# Patient Record
Sex: Male | Born: 1965 | Race: White | Hispanic: No | Marital: Married
Health system: Southern US, Community
[De-identification: ages and names within clinical notes are randomized; demographics above are authoritative.]

## PROBLEM LIST (undated history)

## (undated) DIAGNOSIS — F1020 Alcohol dependence, uncomplicated: Secondary | ICD-10-CM

## (undated) DIAGNOSIS — F410 Panic disorder [episodic paroxysmal anxiety] without agoraphobia: Secondary | ICD-10-CM

## (undated) DIAGNOSIS — K625 Hemorrhage of anus and rectum: Secondary | ICD-10-CM

## (undated) DIAGNOSIS — E119 Type 2 diabetes mellitus without complications: Secondary | ICD-10-CM

## (undated) DIAGNOSIS — I10 Essential (primary) hypertension: Secondary | ICD-10-CM

## (undated) HISTORY — DX: Hemorrhage of anus and rectum: K62.5

## (undated) HISTORY — PX: OTHER SURGICAL HISTORY: SHX169

## (undated) HISTORY — DX: Alcohol dependence, uncomplicated: F10.20

## (undated) HISTORY — DX: Essential (primary) hypertension: I10

---

## 2004-10-16 ENCOUNTER — Ambulatory Visit (HOSPITAL_BASED_OUTPATIENT_CLINIC_OR_DEPARTMENT_OTHER): Admission: RE | Admit: 2004-10-16 | Discharge: 2004-10-16 | Payer: Self-pay | Admitting: Family Medicine

## 2007-11-14 ENCOUNTER — Emergency Department (HOSPITAL_COMMUNITY): Admission: EM | Admit: 2007-11-14 | Discharge: 2007-11-14 | Payer: Self-pay | Admitting: Emergency Medicine

## 2007-11-16 ENCOUNTER — Ambulatory Visit (HOSPITAL_COMMUNITY): Admission: RE | Admit: 2007-11-16 | Discharge: 2007-11-16 | Payer: Self-pay | Admitting: Orthopedic Surgery

## 2008-02-10 ENCOUNTER — Ambulatory Visit (HOSPITAL_COMMUNITY): Admission: RE | Admit: 2008-02-10 | Discharge: 2008-02-10 | Payer: Self-pay | Admitting: Orthopedic Surgery

## 2010-07-19 ENCOUNTER — Encounter: Admission: RE | Admit: 2010-07-19 | Discharge: 2010-07-19 | Payer: Self-pay | Admitting: Family Medicine

## 2011-01-19 ENCOUNTER — Encounter: Payer: Self-pay | Admitting: Family Medicine

## 2011-04-06 ENCOUNTER — Emergency Department (HOSPITAL_COMMUNITY): Payer: BC Managed Care – PPO

## 2011-04-06 ENCOUNTER — Emergency Department (HOSPITAL_COMMUNITY)
Admission: EM | Admit: 2011-04-06 | Discharge: 2011-04-07 | Disposition: A | Discharge status: Other Acute Inpatient Hospital | Payer: BC Managed Care – PPO | Attending: Emergency Medicine | Admitting: Emergency Medicine

## 2011-04-06 DIAGNOSIS — IMO0002 Reserved for concepts with insufficient information to code with codable children: Secondary | ICD-10-CM | POA: Insufficient documentation

## 2011-04-06 DIAGNOSIS — Z79899 Other long term (current) drug therapy: Secondary | ICD-10-CM | POA: Insufficient documentation

## 2011-04-06 DIAGNOSIS — F411 Generalized anxiety disorder: Secondary | ICD-10-CM | POA: Insufficient documentation

## 2011-04-06 DIAGNOSIS — G8929 Other chronic pain: Secondary | ICD-10-CM | POA: Insufficient documentation

## 2011-04-06 DIAGNOSIS — K7460 Unspecified cirrhosis of liver: Secondary | ICD-10-CM | POA: Insufficient documentation

## 2011-04-06 DIAGNOSIS — M25519 Pain in unspecified shoulder: Secondary | ICD-10-CM | POA: Insufficient documentation

## 2011-04-06 DIAGNOSIS — F1010 Alcohol abuse, uncomplicated: Secondary | ICD-10-CM | POA: Insufficient documentation

## 2011-04-06 LAB — CBC
HCT: 40 % (ref 39.0–52.0)
Hemoglobin: 13.8 g/dL (ref 13.0–17.0)

## 2011-04-06 LAB — DIFFERENTIAL
Basophils Absolute: 0.1 10*3/uL (ref 0.0–0.1)
Basophils Relative: 1 % (ref 0–1)
Eosinophils Absolute: 0.1 10*3/uL (ref 0.0–0.7)
Lymphs Abs: 2 10*3/uL (ref 0.7–4.0)
Monocytes Absolute: 0.8 10*3/uL (ref 0.1–1.0)
Monocytes Relative: 8 % (ref 3–12)
Neutro Abs: 6.6 10*3/uL (ref 1.7–7.7)
Neutrophils Relative %: 70 % (ref 43–77)

## 2011-04-06 LAB — COMPREHENSIVE METABOLIC PANEL
ALT: 50 U/L (ref 0–53)
BUN: 7 mg/dL (ref 6–23)
Calcium: 8.1 mg/dL — ABNORMAL LOW (ref 8.4–10.5)
GFR calc Af Amer: >60 mL/min (ref >60)
GFR calc non Af Amer: >60 mL/min (ref >60)
Glucose, Bld: 162 mg/dL — ABNORMAL HIGH (ref 70–99)
Potassium: 3.8 mEq/L (ref 3.5–5.1)
Sodium: 139 mEq/L (ref 135–145)

## 2011-04-06 LAB — URINALYSIS, ROUTINE W REFLEX MICROSCOPIC
Bilirubin Urine: NEGATIVE
Glucose, UA: NEGATIVE mg/dL
Ketones, ur: NEGATIVE mg/dL
Nitrite: NEGATIVE
Specific Gravity, Urine: 1.012 (ref 1.005–1.030)

## 2011-04-06 LAB — RAPID URINE DRUG SCREEN, HOSP PERFORMED
Benzodiazepines: NOT DETECTED
Opiates: NOT DETECTED
Tetrahydrocannabinol: NOT DETECTED

## 2011-04-06 LAB — ACETAMINOPHEN LEVEL: Acetaminophen (Tylenol), Serum: <10 ug/mL — ABNORMAL LOW (ref 10–30)

## 2011-04-06 LAB — SALICYLATE LEVEL: Salicylate Lvl: <4 mg/dL (ref 2.8–20.0)

## 2011-04-07 ENCOUNTER — Inpatient Hospital Stay (HOSPITAL_COMMUNITY)
Admission: AD | Admit: 2011-04-07 | Discharge: 2011-04-11 | DRG: 750 | Disposition: A | Discharge status: Nursing Home (Includes ICF) | Payer: BC Managed Care – PPO | Source: Ambulatory Visit | Attending: Psychiatry | Admitting: Psychiatry

## 2011-04-07 DIAGNOSIS — K769 Liver disease, unspecified: Secondary | ICD-10-CM | POA: Diagnosis present

## 2011-04-07 DIAGNOSIS — F1020 Alcohol dependence, uncomplicated: Principal | ICD-10-CM | POA: Diagnosis present

## 2011-04-07 DIAGNOSIS — I10 Essential (primary) hypertension: Secondary | ICD-10-CM | POA: Diagnosis present

## 2011-04-07 DIAGNOSIS — D696 Thrombocytopenia, unspecified: Secondary | ICD-10-CM | POA: Diagnosis present

## 2011-04-08 DIAGNOSIS — F1020 Alcohol dependence, uncomplicated: Secondary | ICD-10-CM

## 2011-04-09 LAB — HEPATIC FUNCTION PANEL
AST: 188 U/L — ABNORMAL HIGH (ref 0–37)
Alkaline Phosphatase: 166 U/L — ABNORMAL HIGH (ref 39–117)
Total Protein: 8 g/dL (ref 6.0–8.3)

## 2011-04-09 LAB — HEMOGLOBIN: Hemoglobin: 13.4 g/dL (ref 13.0–17.0)

## 2011-04-09 LAB — AMMONIA: Ammonia: 71 umol/L — ABNORMAL HIGH (ref 11–35)

## 2011-04-09 NOTE — H&P (Signed)
NAME:  Hunter Davis, Hunter Davis NO.:  [id number]  MEDICAL RECORD NO.:  [id number]           PATIENT TYPE:  I  LOCATION:  0300                          FACILITY:  [facility]  PHYSICIAN:  Anselm Jungling, MD  DATE OF BIRTH:  [birth year]  DATE OF ADMISSION:  04/07/2011 DATE OF DISCHARGE:                      PSYCHIATRIC ADMISSION ASSESSMENT   This is on a 45 year old male that was voluntarily admitted on April 07, 2011.  HISTORY OF PRESENT ILLNESS:  Patient presented here to get help from his alcohol use.  He states that he is "drinking too much."  He states that his wife wanted him to get help to avert any physical problems.  He reports drinking in the morning.  He has been experiencing blackouts. He denies any seizure activity.  Has been drinking more consistently and heavier since his shoulder injury.  He is unemployed.  He denies any suicidal thoughts.  PAST PSYCHIATRIC HISTORY:  First admission to the [facility].  No current outpatient mental health therapy.  SOCIAL HISTORY:  Patient is married.  He has no children.  He is unemployed.  He has no legal issues.  FAMILY HISTORY:  His aunt has been on lithium.  ALCOHOL AND DRUG HISTORY:  Again, has been drinking heavily without seizures but experiencing blackouts and drinking in the morning.  His blood alcohol level was 461 on admission.  Denies any other recreational drug use.  PRIMARY CARE PROVIDER:  [facility].  MEDICAL PROBLEMS: 1. History of hypertension. 2. Sleep apnea.  MEDICATIONS:  Patient lists: 1. Oxycodone. 2. Combivent inhaler. 3. Losartan 100/12.5 daily for hypertension.  DRUG ALLERGIES:  No known allergies.  PHYSICAL EXAM:  Reviewed.  Patient moves all extremities.  No obvious focal weakness.  Had some tenderness to his right shoulder.  He was wearing a sling from an old injury.  His mental status appeared baseline.  No facial asymmetry.  Patient was initially in the emergency room  complaining of coughing up blood but no blood was noticed in the posterior pharynx.  LABORATORY DATA:  Shows a platelet count of 119.  Urinalysis is negative.  Urine drug screen is negative.  Acetaminophen level less than 10.  Alcohol level initial was 461.  Aspirin was less than 4.  His SGOT is at 281, SGPT of 50, alkaline phosphate also elevated at 195.  MENTAL STATUS EXAM:  Patient is resting in bed with his CPAP on.  He was arousable.  He sat up on the side of the bed to participate in the interview.  He had poor eye contact, somewhat disheveled.  Speech is soft spoken.  Mood is neutral.  He appears somewhat sad, depressed, but he denies any suicidal or homicidal thoughts or psychotic symptoms. Initially seemed somewhat confused but then he was able to recall answers when questions were asked.  AXIS I:  Alcohol dependence. AXIS II:  Deferred. AXIS III: 1. A history of sleep apnea. 2. Hypertension. 3. Thrombocytopenia. AXIS IV:  Problems with primary support group, occupation, other psychosocial problems. AXIS V:  Current is 40.  PLAN:  Place patient on the Librium protocol.  We will have a  CPAP machine available.  We will contact wife for support.  Assess his motivation for rehab.  Patient will need to follow up with his primary care provider for elevated liver enzymes and thrombocytopenia.     Landry Corporal, N.P.   ______________________________ Anselm Jungling, MD    JO/MEDQ  D:  04/08/2011  T:  04/08/2011  Job:  [id number]  Electronically Signed by Limmie PatriciaP. on 04/08/2011 02:12:52 PM Electronically Signed by Geralyn Flash MD on 04/09/2011 10:49:15 AM

## 2011-04-11 ENCOUNTER — Inpatient Hospital Stay (HOSPITAL_COMMUNITY)
Admission: EM | Admit: 2011-04-11 | Discharge: 2011-04-14 | DRG: 557 | Disposition: A | Discharge status: Other Acute Inpatient Hospital | Payer: BC Managed Care – PPO | Source: Ambulatory Visit | Attending: Internal Medicine | Admitting: Internal Medicine

## 2011-04-11 ENCOUNTER — Inpatient Hospital Stay: Admit: 2011-04-11 | Payer: Self-pay | Source: Ambulatory Visit | Admitting: Internal Medicine

## 2011-04-11 DIAGNOSIS — K7030 Alcoholic cirrhosis of liver without ascites: Principal | ICD-10-CM | POA: Diagnosis present

## 2011-04-11 DIAGNOSIS — E871 Hypo-osmolality and hyponatremia: Secondary | ICD-10-CM | POA: Diagnosis present

## 2011-04-11 DIAGNOSIS — J4489 Other specified chronic obstructive pulmonary disease: Secondary | ICD-10-CM | POA: Diagnosis present

## 2011-04-11 DIAGNOSIS — G4733 Obstructive sleep apnea (adult) (pediatric): Secondary | ICD-10-CM | POA: Diagnosis present

## 2011-04-11 DIAGNOSIS — D649 Anemia, unspecified: Secondary | ICD-10-CM | POA: Diagnosis present

## 2011-04-11 DIAGNOSIS — K7682 Hepatic encephalopathy: Secondary | ICD-10-CM | POA: Diagnosis present

## 2011-04-11 DIAGNOSIS — I959 Hypotension, unspecified: Secondary | ICD-10-CM | POA: Diagnosis present

## 2011-04-11 DIAGNOSIS — F1020 Alcohol dependence, uncomplicated: Secondary | ICD-10-CM | POA: Diagnosis present

## 2011-04-11 DIAGNOSIS — D696 Thrombocytopenia, unspecified: Secondary | ICD-10-CM | POA: Diagnosis present

## 2011-04-11 DIAGNOSIS — J449 Chronic obstructive pulmonary disease, unspecified: Secondary | ICD-10-CM | POA: Diagnosis present

## 2011-04-11 DIAGNOSIS — N179 Acute kidney failure, unspecified: Secondary | ICD-10-CM | POA: Diagnosis present

## 2011-04-11 DIAGNOSIS — K7290 Hepatic failure, unspecified without coma: Secondary | ICD-10-CM | POA: Diagnosis present

## 2011-04-11 LAB — PROTIME-INR: INR: 1.37 (ref 0.00–1.49)

## 2011-04-11 LAB — DIFFERENTIAL
Basophils Absolute: 0 10*3/uL (ref 0.0–0.1)
Eosinophils Relative: 2 % (ref 0–5)
Lymphocytes Relative: 17 % (ref 12–46)
Monocytes Absolute: 1.3 10*3/uL — ABNORMAL HIGH (ref 0.1–1.0)
Monocytes Relative: 15 % — ABNORMAL HIGH (ref 3–12)
Neutro Abs: 5.4 10*3/uL (ref 1.7–7.7)

## 2011-04-11 LAB — CBC
MCHC: 34 g/dL (ref 30.0–36.0)
Platelets: 120 10*3/uL — ABNORMAL LOW (ref 150–400)
RBC: 3.87 MIL/uL — ABNORMAL LOW (ref 4.22–5.81)
RDW: 19.3 % — ABNORMAL HIGH (ref 11.5–15.5)
WBC: 8.3 10*3/uL (ref 4.0–10.5)

## 2011-04-11 LAB — COMPREHENSIVE METABOLIC PANEL
ALT: 30 U/L (ref 0–53)
Albumin: 2.5 g/dL — ABNORMAL LOW (ref 3.5–5.2)
BUN: 27 mg/dL — ABNORMAL HIGH (ref 6–23)
Chloride: 97 mEq/L (ref 96–112)
GFR calc non Af Amer: 46 mL/min — ABNORMAL LOW (ref >60)
Glucose, Bld: 169 mg/dL — ABNORMAL HIGH (ref 70–99)

## 2011-04-11 LAB — AMMONIA: Ammonia: 79 umol/L — ABNORMAL HIGH (ref 11–35)

## 2011-04-11 NOTE — H&P (Signed)
NAME:  Hunter Davis, BOUILLON NO.:  [id number]  MEDICAL RECORD NO.:  [id number]           PATIENT TYPE:  O  LOCATION:  ED                           FACILITY:  [facility]  PHYSICIAN:  Talmage Nap, MD  DATE OF BIRTH:  [birth year]  DATE OF ADMISSION:  04/10/2011 DATE OF DISCHARGE:                               HISTORY & PHYSICAL   PRIMARY CARE PHYSICIAN:  Unassigned.  History obtainable from spouse mainly and partly from the patient.  CHIEF COMPLAINT:  Low blood pressure observed at [facility] on April 10, 2011.  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  However, on April 10, 2011, the patient was noticed to have a low blood pressure. Systolic blood pressure was said to be 80 mmHg and the patient was also said to be slightly disoriented.  There was no history of fever, no chills, no rigor, no vomiting.  The patient claimed that periodically he was being given lactulose and had an episode of diarrhea.  He denies any dysuria or hematuria.  Also denies any history of hematochezia.  The patient's spouse observed that since the patient has been in [facility], his appetite has been very poor and also with poor oral intake. The patient's blood pressure was said to have remained persistently low. Hence, he was brought to the emergency room to be evaluated.  PAST MEDICAL HISTORY: 1. 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 2. Hypertension. 3. Chronic alcoholism. 4. GERD. 5. Sleep apnea.  PAST SURGICAL HISTORY: Right rotator cuff repair.  MEDICATIONS:  Preadmission meds without dosages include: 1. Multivitamin 2. Thiamine. 3. Librium. 4. Paxil. 5. Cozaar. 6. Hydrochlorothiazide. 7. Protonix. 8. Carafate. 9. C-chronulose. 10.Antacid/antigas. 11.Mild of magnesia  oral. 12.Vistaril oral. 13.Zofran. 14.Imodium oral. 15.Ipratropium/albuterol inhaler.  ALLERGIES:  He has no known allergy.  SOCIAL HISTORY:  The patient drinks a pint of vodka almost every day and has been drinking in the past 30 years.  No history of tobacco use. Unemployed and is presently undergoing alcohol detox at the [facility].  FAMILY HISTORY:  Unknown.  REVIEW OF SYSTEMS:  The patient denies any history of headaches.  No blurred vision.  No nausea or vomiting.  No fever, no chills, no rigor, no chest pain, no shortness of breath.  Complained of mild epigastric discomfort.  Denies any diarrhea or hematochezia.  No dysuria or hematuria.  No swelling of the lower extremities.  No intolerance to heat or cold.  No known psychiatric disorder.  PHYSICAL EXAMINATION:  GENERAL:  Young man, jaundiced, looking very unkempt with suboptimal hydration. VITAL SIGNS:  His present vital signs are blood pressure 102/56, pulse is 82, respiratory rate 18, temperature is 98.2. HEENT:  Icteric, pallor.  Both pupils are reactive to light and extraocular muscles are intact. NECK:  No jugular venous distention.  No carotid bruit.  No lymphadenopathy. CHEST:  Shows spider telangectasia, otherwise, air entry is adequate.  No adventitial sounds. HEART:  Heart sounds are S1 and  S2. ABDOMEN:  Soft with mild epigastric tenderness.  Liver, spleen tip not palpable.  Bowel sounds are positive. EXTREMITIES:  No pedal edema. NEUROLOGIC EXAM:  Negative for asterixis. NEUROPSYCHIATRIC:  Evaluation is unremarkable. SKIN:  Showed decreased turgor.  LABORATORY DATA:  Ammonia level is 79.  Comprehensive metabolic panel shows sodium of 129, potassium of 4.2, chloride of 97, bicarb is 23 glucose is 169, BUN is 27, creatinine is 1.62, calcium is 7.9, total protein 7.3, albumin is 2.5.  LFTs showed AST of 157, ALT 30, alkaline phosphatase 139, total bilirubin 7.2.  Coagulation profile showed PT 17.1,  INR 1.37 and APTT of 38.  Hematological indices showed WBC of 8.3, hemoglobin of 11.9, hematocrit 35.0, MCV of 90.4 with a platelet count of 120.  IMPRESSION: 1. Decompensated liver cirrhosis. 2. Hypotension.  Blood pressure not stable. 3. Chronic alcohol abuse (the patient still actively drinks). 4. Obstructive sleep apnea. 5. Gastroesophageal reflux disease. 6. Asthma. 7. Hyponatremia. 8. Thrombocytopenia.  PLAN:  Plan is to admit the patient to general medical floor.  The patient will be rehydrated with normal saline IV to go at rate of 75 cc an hour.  He will be on Protonix IV q.12 and, omeprazole 40 mg p.o. daily.  Other medications to be given to the patient include lactulose 20 cc p.o. t.i.d. and Paxil 10 mg p.o. b.i.d.  The patient will also be on thiamine 100 mg p.o. daily, and on folic acid 1 mg p.o. daily.  He will be given CPAP at night, titrate for obstructive sleep apnea and TED stockings for DVT prophylaxis. Other labs to be ordered on this patient will include repeating ammonia level as well as coagulation profile, PT, PTT, and INR.  CBC, CMP and magnesium will be repeated in a.m.  The patient will be followed and anticoagulated on day-to-day basis.     Talmage Nap, MD     CN/MEDQ  D:  04/11/2011  T:  04/11/2011  Job:  [id number]  Electronically Signed by Talmage Nap  on 04/11/2011 05:31:24 AM

## 2011-04-12 LAB — PROTIME-INR: INR: 1.43 (ref 0.00–1.49)

## 2011-04-12 LAB — DIFFERENTIAL
Basophils Absolute: 0 10*3/uL (ref 0.0–0.1)
Eosinophils Absolute: 0.1 10*3/uL (ref 0.0–0.7)
Eosinophils Relative: 2 % (ref 0–5)
Lymphocytes Relative: 16 % (ref 12–46)
Lymphs Abs: 1 10*3/uL (ref 0.7–4.0)
Neutrophils Relative %: 66 % (ref 43–77)

## 2011-04-12 LAB — CBC
HCT: 33.9 % — ABNORMAL LOW (ref 39.0–52.0)
MCV: 92.4 fL (ref 78.0–100.0)
Platelets: 110 10*3/uL — ABNORMAL LOW (ref 150–400)
RBC: 3.67 MIL/uL — ABNORMAL LOW (ref 4.22–5.81)
RDW: 20.2 % — ABNORMAL HIGH (ref 11.5–15.5)
WBC: 6.2 10*3/uL (ref 4.0–10.5)

## 2011-04-12 LAB — COMPREHENSIVE METABOLIC PANEL
AST: 136 U/L — ABNORMAL HIGH (ref 0–37)
Albumin: 2.2 g/dL — ABNORMAL LOW (ref 3.5–5.2)
BUN: 11 mg/dL (ref 6–23)
Creatinine, Ser: 0.72 mg/dL (ref 0.4–1.5)
GFR calc Af Amer: >60 mL/min (ref >60)
Total Protein: 6.8 g/dL (ref 6.0–8.3)

## 2011-04-12 LAB — AMMONIA: Ammonia: 66 umol/L — ABNORMAL HIGH (ref 11–35)

## 2011-04-12 LAB — APTT: aPTT: 42 seconds — ABNORMAL HIGH (ref 24–37)

## 2011-04-13 LAB — LIPID PANEL
Cholesterol: 174 mg/dL (ref 0–200)
HDL: 20 mg/dL — ABNORMAL LOW (ref >39)
LDL Cholesterol: 119 mg/dL — ABNORMAL HIGH (ref 0–99)
Total CHOL/HDL Ratio: 8.7 RATIO
Triglycerides: 175 mg/dL — ABNORMAL HIGH (ref <150)

## 2011-04-18 NOTE — Discharge Summary (Signed)
  NAME:  Hunter Davis, BUSCEMI NO.:  [id number]  MEDICAL RECORD NO.:  [id number]           PATIENT TYPE:  I  LOCATION:  0300                          [facility]ACILITY:  [facility]  PHYSICIAN:  Anselm Jungling, MD  DATE OF BIRTH:  08-19-1966  DATE OF ADMISSION:  04/07/2011 DATE OF DISCHARGE:  04/11/2011                              DISCHARGE SUMMARY   IDENTIFYING INFORMATION:  This is a 45 year old Caucasian male.  This is a voluntary admission.  HISTORY OF PRESENT ILLNESS:  First [facility] admission for [name] who presented requesting detox from alcohol.  His wife had been encouraging to come for treatment.  He had been experiencing blackouts and drinking more consistently and heavier since he had had a recent shoulder injury.  He was receiving no current outpatient treatment at the time of admission but had a history of heavy alcohol use and presented with an initial alcohol level of 461 mg/dL.  MEDICAL EVALUATION:  A full physical exam was done in the emergency room and is noted in the record.  He was noted to be thrombocytopenic with a platelet count of 119,000.  Initial alcohol level of 461 mg/dL. Elevated transaminases SGOT 281, SGPT 50, alkaline phosphatase 195 and a total bilirubin of 3.7.  CHRONIC MEDICAL PROBLEMS: 1. Alcohol-induced liver dysfunction. 2. Hypertension. 3. Sleep apnea.  COURSE OF HOSPITALIZATION:  He was admitted to our Dual Diagnosis Unit and gradually assimilated into the milieu.  He was started on a Librium detox protocol and he continued using his CPAP machine for sleep apnea while he was here.  He appeared to be mildly icteric in appearance and we repeated his hepatic function panel which revealed a total bilirubin of 8.1, SGOT 188, SGPT 37 and alkaline phosphatase 166.  His ammonia level was also found to be 71 and he was started on lactulose 30 grams t.i.d.  His participation in group and unit activities was appropriate.  On the evening of April 10, 2011, he was found to be tachycardic with a pulse of 112, O2 saturation of 87% and blood pressure 87/58 and was transferred to our emergency room for further evaluation and subsequently admitted to our Medical Unit.  DISCHARGE DIAGNOSES:  AXIS I:  Alcohol dependence. AXIS II:  No diagnosis. AXIS III:  Sleep apnea, rule out alcohol-induced liver dysfunction, rule out alcohol-induced thrombocytopenia. AXIS IV:  Deferred. AXIS V:  Deferred.     Margaret A. Lorin Picket, N.P.   ______________________________ Anselm Jungling, MD    MAS/MEDQ  D:  04/14/2011  T:  04/14/2011  Job:  [id number]  Electronically Signed by Kari Baars N.P. on 04/14/2011 11:02:17 AM Electronically Signed by Geralyn Flash MD on 04/18/2011 11:38:30 AM

## 2011-04-22 NOTE — Discharge Summary (Signed)
NAME:  Hunter Davis, Hunter Davis               ACCOUNT NO.:  [id number]  MEDICAL RECORD NO.:  [id number]           PATIENT TYPE:  I  LOCATION:  1506                         FACILITY:  [facility]  PHYSICIAN:  Nyja Westbrook I Bryse Blanchette, MD      DATE OF BIRTH:  1966/09/12  DATE OF ADMISSION:  04/11/2011 DATE OF DISCHARGE:  04/14/2011                              DISCHARGE SUMMARY   The patient will be discharged to [facility] for further assessment with alcohol detox.  DISCHARGE DIAGNOSES: 1. Decompensated liver failure with mild hepatic encephalopathy,     improved. 2. Abnormal liver function tests secondary to alcoholic liver disease. 3. Ongoing alcohol abuse and dependence. 4. Acute knee injury, resolved. 5. Obstructive sleep apnea. 6. History of asthma/chronic obstructive pulmonary disease. 7. Thrombocytopenia. 8. Anemia with stable hemoglobin around 11.4.  DISCHARGE MEDICATIONS: 1. Combivent 1 nebs q.6 h. p.r.n. 2. Aldactone 25 mg p.o. daily. 3. Folic acid 1 mg p.o. daily. 4. Thiamine 100 mg p.o. daily. 5. Lactulose 20 mL p.o. b.i.d. to have 2-3 bowel movements daily. 6. Rifaximin 200 mg p.o. daily. 7. Prednisone 30 mg p.o. daily to be taper within 10 days.  FOLLOWUP:  The patient will follow up with his physician within 1-2 weeks to follow up his liver enzymes.  CONSULTATION:  None.  PROCEDURE:  None.  HOSPITAL COURSE:  This is a 45 year old man with a history of liver cirrhosis secondary to alcohol abuse who presented with confusion and found to have a low blood pressure.  On IV fluids, his kidney failure improved to 0.72.  There was no evidence of bleeding, either hemoptysis or hematemesis.  His hemoglobin remained stable.  The patient denied any chest pain.  His LFTs were significantly elevated with a bilirubin of 7, alkaline phosphatase 122, AST 136, and ALT 32, which is more likely secondary to alcoholic cirrhosis.  The patient's LFTs trending down and the patient was advised to  follow up with his physician within 1 week to further assess his liver enzymes.  Currently, the patient is jaundiced. His liver enzymes on April 11th, total bilirubin 8.1, direct bilirubin 4.4, and indirect bilirubin 3.7, alk phos 166, AST 188, ALT 37, total protein 8, albumin 2.7.  Today, the patient's LFTs is total bilirubin 7, alkaline phosphatase 122, AST 136, ALT 32, total protein 6.8, albumin 2.2, and calcium 8.2.  Chest x-ray, cardiomegaly and chronic bronchitic changes and chronic interstitial lung disease.  PHYSICAL EXAMINATION:  VITAL SIGNS:  Temperature 98.3, pulse rate 73, blood pressure 106/63, respiratory rate 18. HEENT:  The patient is jaundiced, not pale.  Not in respiratory distress. LUNGS:  Bilateral wheezing. HEART:  S1, S2.  No tachycardia.  No abnormal rhythm. ABDOMEN:  Distended.  Bowel sound positive.  Hepatomegaly, about 4 cm below the costophrenic angle. EXTREMITY:  No lower limb edema.  The patient will be discharged to [facility], that will be low- protein diet, hepatic diet.  ACTIVITY:  As tolerated.     Caridad Silveira Bosie Helper, MD     HIE/MEDQ  D:  04/14/2011  T:  04/15/2011  Job:  [id number]  Electronically Signed by  Ebony Cargo MD on 04/22/2011 01:50:41 PM

## 2011-04-29 NOTE — Group Therapy Note (Signed)
  NAME:  Hunter Davis, Hunter Davis NO.:  [id number]  MEDICAL RECORD NO.:  [id number]           PATIENT TYPE:  I  LOCATION:  1506                         FACILITY:  [facility]  PHYSICIAN:  Homero Fellers, MD   DATE OF BIRTH:  09/02/1966                                PROGRESS NOTE   SUBJECTIVE: The patient is more alert today.  No new complaint except for loose stool probably secondary to lactulose.  OBJECTIVE: Vital Signs:  Blood pressure of 113/74, pulse 80, respirations 18, temperature is 97.5, O2 sat is 94% on room air.  General:  The patient is comfortable, appeared to be in no distress.  HEENT:  Pallor. Extraocular muscles are intact.  Neck:  Supple.  No JVD, adenopathy or thyromegaly.  LUNGS:  Mild expiratory wheezing.  Abdomen:  Slightly distended.  Bowel sounds present.  No masses.  Extremities:  Trace edema.  Laboratory:  Hemoglobin 11.4, white count 6.2, platelet count is 110. Magnesium 1.9, ammonia down to 66, potassium 3.5, creatinine down to 0.72.  ASSESSMENT: 1. Decompensated liver failure with mild hepatic encephalopathy, which     is clinically improving. 2. Acute kidney failure which has also improved with hydration. 3. Continued alcohol abuse:  The patient and family are able to     inpatient rehab at this time. 4. Obstructive sleep apnea. 5. History of asthma with mild wheezing. 6. Chronic thrombocytopenia. 7. History of hyperlipidemia:  No lipid panel has been done yet.  PLAN: Reduced lactulose to twice a day.  Check lipid panel in the morning. Continue other supportive care.  The patient's family has arranged for an inpatient alcohol treatment program.  The social worker over here should coordinate the discharge which is planned for this coming Monday. Overall condition is stable.     Homero Fellers, MD     FA/MEDQ  D:  04/12/2011  T:  04/12/2011  Job:  [id number]  Electronically Signed by Homero Fellers  on 04/29/2011 02:37:04 AM

## 2011-04-29 NOTE — Discharge Summary (Signed)
NAME:  Hunter Davis, Hunter Davis NO.:  [id number]  MEDICAL RECORD NO.:  [id number]           PATIENT TYPE:  I  LOCATION:  1506                         FACILITY:  [facility]  PHYSICIAN:  Homero Fellers, MD   DATE OF BIRTH:  1966/01/17  DATE OF ADMISSION:  04/11/2011 DATE OF DISCHARGE:  04/10/2011                              DISCHARGE SUMMARY   Interim Discharge Summary  DISPOSITION:  To follow should call inpatient alcohol treatment program.  DISCHARGE DIAGNOSES: 1. Decompensated liver failure with mild hepatic encephalopathy, which     is improved. 2. Continuous alcohol abuse and dependence. 3. Acute kidney injury, which is currently improved with hydration. 4. Obstructive sleep apnea. 5. History of asthma, which is improved. 6. Chronic thrombocytopenia possibly secondary from liver cirrhosis. 7. Hyperlipidemia.  MEDICATION:  To be determined by the discharging physician.  CONSULTATIONS:  None.  PROCEDURES:  None.  HOSPITAL COURSE:  This is a 45 year old man with history of liver cirrhosis secondary to alcohol use, who presented with confusion and was found to have a low blood pressure at the  on April 10, 2011.  He was also noted to have poor appetite and was subsequently sent to the emergency room for evaluation.  He was admitted to triad group at [facility] and was optimized on IV fluids.  His kidney failure also improved with hydration from creatinine of 1.62 on admission to 0.72 before discharge.  The patient was also placed on lactulose for his confusion.  There was no withdrawal symptoms noticed throughout this admission.  He was also maintained on thiamine and multivitamin.  PHYSICAL EXAMINATION:  NEUROLOGIC:  At the time of this elevation, patient is awake and alert and oriented x3. VITAL SIGNS:  Blood pressure 143/66, pulse 75, respirations 18, temperature is 98.5, O2 sat is 94% on room air. GENERAL EXAMINATION:  Patient is comfortable, in no  distress. NECK:  Supple.  No JVD. LUNGS:  Clear bilaterally to auscultation. HEART:  S1, S2.  No murmurs. ABDOMEN:  Obese and slightly distended.  Bowel sounds present.  No masses. EXTREMITIES:  Trace edema.  LABORATORY DATA:  His ammonia level at admission was 71, which increased to 79, but it began to trend down to 66 yesterday.  The patient also had lipid panel done on this admission, which showed triglyceride of 175, LDL of 119 and total cholesterol of 174.  HDL was only 20.  CBC showed white count of 6.2; hemoglobin 11.4; platelet count of 110,00 likely low because of liver disease.  IMAGING STUDIES:  Not available.  The family has made arrangements for inpatient alcohol treatment at [facility].  I have also notified the case worker to coordinate the discharge, which should hopefully take place tomorrow.  From medical standpoint, patient appeared to be stable for discharge.  The family does not want him to be discharged home.  I think this is reasonable in view of his significant alcohol use despite obvious liver damage.  Patient should be discharged home on lactulose, which he can take once or twice a day and can take more doses as needed.  DIAGNOSES:  1. Decompensated liver failure, treated with lactulose.  This has     improved. 2. Acute kidney failure, which was treated with hydration.  This has     resolved. 3. Continued alcohol abuse:  Family has made arrangement for the     patient to go to inpatient alcohol treatment program. 4. Obstructive sleep apnea:  Patient can continue continuous positive     airway pressure at home.  Denies history of asthma.  Patient had     some wheezing on this admission and was placed on nebulizer.  At     the time of this dictation, wheezing has resolved.  The patient can     continue on his nebulizer and other aspirin medicine at home. 5. Thrombocytopenia:  Likely from liver disease and is fairly stable.  CONDITION AT DISCHARGE:   Stable.  DIET:  Low salt.  ACTIVITY:  As tolerated.  FOLLOWUP:  Patient should follow with primary care physician at the time of discharge.  I do not have the name of his physician available at this time.     Homero Fellers, MD     FA/MEDQ  D:  04/13/2011  T:  04/13/2011  Job:  [id number]  Electronically Signed by Homero Fellers  on 04/29/2011 02:37:01 AM

## 2011-05-04 ENCOUNTER — Emergency Department (HOSPITAL_COMMUNITY)
Admission: EM | Admit: 2011-05-04 | Discharge: 2011-05-04 | Disposition: A | Discharge status: Home / Self Care | Payer: BC Managed Care – PPO | Attending: Emergency Medicine | Admitting: Emergency Medicine

## 2011-05-04 DIAGNOSIS — K7460 Unspecified cirrhosis of liver: Secondary | ICD-10-CM | POA: Insufficient documentation

## 2011-05-04 DIAGNOSIS — K219 Gastro-esophageal reflux disease without esophagitis: Secondary | ICD-10-CM | POA: Insufficient documentation

## 2011-05-04 DIAGNOSIS — K644 Residual hemorrhoidal skin tags: Secondary | ICD-10-CM | POA: Insufficient documentation

## 2011-05-04 DIAGNOSIS — K625 Hemorrhage of anus and rectum: Secondary | ICD-10-CM | POA: Insufficient documentation

## 2011-05-04 DIAGNOSIS — I10 Essential (primary) hypertension: Secondary | ICD-10-CM | POA: Insufficient documentation

## 2011-05-04 LAB — CBC
HCT: 34.9 % — ABNORMAL LOW (ref 39.0–52.0)
MCH: 32.2 pg (ref 26.0–34.0)
MCHC: 33.8 g/dL (ref 30.0–36.0)
MCV: 95.1 fL (ref 78.0–100.0)
RDW: 18.5 % — ABNORMAL HIGH (ref 11.5–15.5)
WBC: 7.3 10*3/uL (ref 4.0–10.5)

## 2011-05-04 LAB — DIFFERENTIAL
Basophils Relative: 1 % (ref 0–1)
Eosinophils Relative: 3 % (ref 0–5)
Lymphs Abs: 1.6 10*3/uL (ref 0.7–4.0)
Monocytes Relative: 8 % (ref 3–12)
Neutro Abs: 4.8 10*3/uL (ref 1.7–7.7)

## 2011-05-13 NOTE — Op Note (Signed)
NAME:  Hunter Davis, Hunter Davis NO.:  [id number]   MEDICAL RECORD NO.:  [id number]          PATIENT TYPE:  AMB   LOCATION:  SDS                          FACILITY:  [facility]   PHYSICIAN:  Almedia Balls. Ranell Patrick, M.D. 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   POSTOPERATIVE DIAGNOSIS:  Right shoulder grade 3 acromioclavicular  separation.   PROCEDURE PERFORMED:  Right shoulder acromioclavicular reduction with  placement of coracoclavicular screw.   ATTENDING SURGEON:  Almedia Balls. Ranell Patrick, M.D.   ASSISTANT:  Donnie Coffin. Durwin Nora, P.A.   General anesthesia plus local was used.   ESTIMATED BLOOD LOSS:  Minimal.   FLUID REPLACEMENT:  1,000 mL crystalloid.   INSTRUMENT COUNTS:  Correct.   No complications.   Perioperative antibiotics were given.   INDICATIONS:  The patient is a 44 year old male with history of a fall  injuring his right shoulder.  The patient presented to the orthopedic  clinic with what appeared to be a grade 3/4 AC separation.  His clavicle  was displaced posteriorly and superiorly.  The patient had extreme pain  with his shoulder.  This happened within the last week due to the  patient's severe pain and our concern that this may be more of a grade 4  AC separation with some puncturing through the trapezius musculature.  We had discussed with the patient options for treatment to include  surgical management versus conservative management and observation.  He  would like to proceed with surgery to restore the [facility] joint congruity and  to stabilize the coracoclavicular interval with a CC screw.  Informed  consent was obtained.   DESCRIPTION OF PROCEDURE:  After an adequate level of anesthesia was  achieved, the patient was positioned in the modified beach-chair  position.  All neurovascular  structures were padded appropriately.  C-  arm was brought over the top of the patient.  After sterile prep and  drape of the right arm, we entered the shoulder through a superior  approach.  This was a saber incision about 1 cm medial to the [facility] joint.  Dissection was carried sharply down through subcutaneous tissues using a  Bovie.  Identified the deltotrapezia fascia, split that in line with the  distal clavicle.  Periosteal dissection of the distal clavicle  performed.  We then made a small deltopectoral incision.  This was about  a 2-cm incision just big enough to put my finger in about 2 to 3 cm  below the coracoid process.  Dissection down through subcutaneous  tissues with Bovie gently dissected bluntly medial to the cephalic vein.  I was able to identify the coracoid process, place my finger along the  medial border of the coracoid and along the top of the coracoid.  We  went ahead and drilled with a 4.2 drill bit through the clavicle to make  the hole for the coracoclavicular screw.  This is a [place] screw by  [employer].  Next, we used our 3.2 drill bit to then drop that down through  that superior drill hole and we used the C-arm to guide [place] as well as  triangulation with my finger medial to the coracoid.  I then went ahead  and dropped the 3.2 drill bit down in center-center on the coracoid  process and did a bicortical drill with that.  We then selected a 45-mm  screw with the washer which engaged nicely the coracoid process and gave  [place] excellent purchase and reduction of the coracoclavicular interval.  Next, we went and thoroughly irrigated both wounds.  We closed the  deltotrapezia fascia over the screw and washer and we did that with a 0  Vicryl suture followed by a 2-0 Vicryl subcutaneous closure and 4-0  Monocryl over skin, 2-0 Vicryl subcutaneous closure on the anterior  incision and 4-0 Monocryl for skin on that as well.  Steri-Strips,  sterile dressing and shoulder  sling.  The patient tolerated surgery  well.      Almedia Balls. Ranell Patrick, M.D.  Electronically Signed     SRN/MEDQ  D:  11/16/2007  T:  11/16/2007  Job:  [id number]

## 2011-05-13 NOTE — Op Note (Signed)
NAME:  Hunter Davis, Hunter Davis NO.:  [id number]   MEDICAL RECORD NO.:  [id number]          PATIENT TYPE:  AMB   LOCATION:  SDS                          FACILITY:  [facility]   PHYSICIAN:  Almedia Balls. Ranell Patrick, M.D. DATE OF BIRTH:  1966-03-01   DATE OF PROCEDURE:  02/10/2008  DATE OF DISCHARGE:                               OPERATIVE REPORT   PREOPERATIVE DIAGNOSIS:  Retained hardware, right shoulder,  coracoclavicular screw.   POSTOPERATIVE DIAGNOSIS:  Retained hardware, right shoulder,  coracoclavicular screw.   PROCEDURE PERFORMED:  Implant removal, deep, right coracoclavicular  screw removal with washer.   ATTENDING SURGEON:  Almedia Balls. Ranell Patrick, M.D.   ASSISTANT:  None.   ANESTHESIA:  General anesthesia was used.   ESTIMATED BLOOD LOSS:  Minimal.   FLUID REPLACEMENT:  200 mL of crystalloid.   INSTRUMENT COUNTS:  Correct.   COMPLICATIONS:  No complications.   INDICATIONS:  The patient is a 45 year old male with a history of severe  right shoulder AC separation requiring coracoclavicular screw placement.  The patient presents now 3 months out from surgery for staged  coracoclavicular screw removal.  Informed consent was obtained.   DESCRIPTION OF PROCEDURE:  After adequate level of anesthesia was  achieved, the patient was positioned supine on the operating table.  The  right shoulder arm was bumped up, sterilely prepped and draped in the  usual manner.  We entered the shoulder through the patient's prior  incision on the dorsal aspect of the shoulder and a small, less than 1-  inch incision was utilized.  Blunt dissection using a hemostat was  performed followed by using a Freer elevator to free up the soft tissue  overlying the screw.  We removed the screw and the washer without  difficulty, thoroughly irrigating and closing with a layered closure of  2-0 Vicryl subcutaneous closure and 3-0 nylon for skin.  A sterile  dressing was applied.  The patient was taken to  the recovery room.      Almedia Balls. Ranell Patrick, M.D.  Electronically Signed    SRN/MEDQ  D:  02/10/2008  T:  02/12/2008  Job:  [id number]

## 2011-06-17 ENCOUNTER — Encounter (INDEPENDENT_AMBULATORY_CARE_PROVIDER_SITE_OTHER): Payer: Self-pay | Admitting: General Surgery

## 2011-07-15 ENCOUNTER — Encounter (INDEPENDENT_AMBULATORY_CARE_PROVIDER_SITE_OTHER): Payer: Self-pay | Admitting: General Surgery

## 2011-07-15 ENCOUNTER — Ambulatory Visit (INDEPENDENT_AMBULATORY_CARE_PROVIDER_SITE_OTHER): Payer: BC Managed Care – PPO | Admitting: General Surgery

## 2011-07-15 VITALS — BP 156/80 | HR 80 | Temp 97.8°F

## 2011-07-15 DIAGNOSIS — K648 Other hemorrhoids: Secondary | ICD-10-CM

## 2011-07-15 NOTE — Progress Notes (Signed)
HPI The patient states that his symptoms have improved considerably since his first injection in the suppositories for he does still have bleeding occasionally either on the toilet tissue or in the, however has vastly improved from before.  He states that he does still have about a 10 suppositories left and he is to take close to their completion.  PE On examination today the patient seems to have hemorrhoidal disease internally at the 4:00 position and the 11:00 position. Under endoscopic guidance I was able to inject sclerosant in 2. Approximately 1 cc was used 3 had a small amount of bleeding subtotally.  Studiy review There are no studies to review for today's examination.  Assessment Improving hemorrhoidal disease. Continues to have some bleeding but irregularity of his stools he hasn't lost by the fact that he has to take lactulose for increased ammonia levels.  Plan I will see the patient again in 3 weeks. Today on examination he was treated and I will no longer using it for the suppositories for 3 weeks.

## 2011-08-12 ENCOUNTER — Encounter (INDEPENDENT_AMBULATORY_CARE_PROVIDER_SITE_OTHER): Payer: BC Managed Care – PPO | Admitting: General Surgery

## 2011-09-19 LAB — BASIC METABOLIC PANEL
BUN: 10
Creatinine, Ser: 0.69
GFR calc non Af Amer: >60
Glucose, Bld: 94
Potassium: 5

## 2011-09-19 LAB — CBC
HCT: 48.8
MCV: 97.9
Platelets: 197
RDW: 12.8

## 2011-09-19 LAB — DIFFERENTIAL
Basophils Absolute: 0.1
Eosinophils Absolute: 0.1
Eosinophils Relative: 2
Lymphocytes Relative: 34

## 2011-09-19 LAB — PROTIME-INR: Prothrombin Time: 11.4 — ABNORMAL LOW

## 2011-09-19 LAB — URINALYSIS, ROUTINE W REFLEX MICROSCOPIC
Glucose, UA: NEGATIVE
Hgb urine dipstick: NEGATIVE
Ketones, ur: NEGATIVE
Protein, ur: NEGATIVE

## 2011-10-07 LAB — BASIC METABOLIC PANEL
BUN: 9
CO2: 25
Chloride: 102
Creatinine, Ser: 1.03

## 2011-10-07 LAB — CBC
MCHC: 35
MCV: 99.8
Platelets: 218

## 2011-10-07 LAB — TYPE AND SCREEN: Antibody Screen: NEGATIVE

## 2011-10-07 LAB — ABO/RH: ABO/RH(D): O POS

## 2011-10-07 LAB — APTT: aPTT: 28

## 2011-10-07 LAB — PROTIME-INR: INR: 0.9

## 2013-08-19 ENCOUNTER — Encounter (HOSPITAL_COMMUNITY): Payer: Self-pay | Admitting: *Deleted

## 2013-08-19 ENCOUNTER — Emergency Department (HOSPITAL_COMMUNITY)
Admission: EM | Admit: 2013-08-19 | Discharge: 2013-08-20 | Disposition: A | Discharge status: Other Acute Inpatient Hospital | Payer: BC Managed Care – PPO | Attending: Emergency Medicine | Admitting: Emergency Medicine

## 2013-08-19 DIAGNOSIS — Z87891 Personal history of nicotine dependence: Secondary | ICD-10-CM | POA: Insufficient documentation

## 2013-08-19 DIAGNOSIS — B820 Intestinal helminthiasis, unspecified: Secondary | ICD-10-CM | POA: Insufficient documentation

## 2013-08-19 DIAGNOSIS — Z8659 Personal history of other mental and behavioral disorders: Secondary | ICD-10-CM | POA: Insufficient documentation

## 2013-08-19 DIAGNOSIS — F10929 Alcohol use, unspecified with intoxication, unspecified: Secondary | ICD-10-CM

## 2013-08-19 DIAGNOSIS — J45909 Unspecified asthma, uncomplicated: Secondary | ICD-10-CM | POA: Insufficient documentation

## 2013-08-19 DIAGNOSIS — F1020 Alcohol dependence, uncomplicated: Secondary | ICD-10-CM

## 2013-08-19 DIAGNOSIS — F10229 Alcohol dependence with intoxication, unspecified: Secondary | ICD-10-CM | POA: Insufficient documentation

## 2013-08-19 DIAGNOSIS — I10 Essential (primary) hypertension: Secondary | ICD-10-CM | POA: Insufficient documentation

## 2013-08-19 HISTORY — DX: Panic disorder (episodic paroxysmal anxiety): F41.0

## 2013-08-19 LAB — COMPREHENSIVE METABOLIC PANEL
ALT: 47 U/L (ref 0–53)
AST: 148 U/L — ABNORMAL HIGH (ref 0–37)
Albumin: 3.7 g/dL (ref 3.5–5.2)
Alkaline Phosphatase: 162 U/L — ABNORMAL HIGH (ref 39–117)
Calcium: 8.5 mg/dL (ref 8.4–10.5)
GFR calc Af Amer: >90 mL/min (ref >90)
Potassium: 3.9 mEq/L (ref 3.5–5.1)
Sodium: 145 mEq/L (ref 135–145)
Total Protein: 8.1 g/dL (ref 6.0–8.3)

## 2013-08-19 LAB — CBC
MCH: 33.8 pg (ref 26.0–34.0)
MCHC: 34.9 g/dL (ref 30.0–36.0)
Platelets: 55 10*3/uL — ABNORMAL LOW (ref 150–400)
RDW: 14.9 % (ref 11.5–15.5)

## 2013-08-19 LAB — RAPID URINE DRUG SCREEN, HOSP PERFORMED
Amphetamines: NOT DETECTED
Barbiturates: NOT DETECTED
Benzodiazepines: NOT DETECTED
Cocaine: NOT DETECTED
Tetrahydrocannabinol: NOT DETECTED

## 2013-08-19 LAB — GLUCOSE, CAPILLARY: Glucose-Capillary: 128 mg/dL — ABNORMAL HIGH (ref 70–99)

## 2013-08-19 MED ORDER — VITAMIN B-1 100 MG PO TABS
100.0000 mg | ORAL_TABLET | Freq: Every day | ORAL | Status: DC
  Administered 2013-08-19 – 2013-08-20 (×2): 100 mg via ORAL
  Filled 2013-08-19 (×2): qty 1

## 2013-08-19 MED ORDER — THIAMINE HCL 100 MG/ML IJ SOLN: 100.0000 mg | Freq: Every day | INTRAMUSCULAR | Status: DC

## 2013-08-19 MED ORDER — LORAZEPAM 1 MG PO TABS: 0.0000 mg | ORAL_TABLET | Freq: Two times a day (BID) | ORAL | Status: DC

## 2013-08-19 MED ORDER — LORAZEPAM 1 MG PO TABS
0.0000 mg | ORAL_TABLET | Freq: Four times a day (QID) | ORAL | Status: DC
  Administered 2013-08-19 – 2013-08-20 (×3): 1 mg via ORAL
  Filled 2013-08-19 (×3): qty 1

## 2013-08-19 NOTE — ED Notes (Signed)
PT AND BELONGINGS SEARCHED/WANDED BY SECURITY  PT BELONGINGS: 1 PAIR OF BROWN SHORTS, 1 PAIR OF BROWN FLIP FLOPS, 1 RED SHIRT, 1 BROWN HAT WITH BLUE LETTERING

## 2013-08-19 NOTE — ED Provider Notes (Signed)
CSN: [id number]     Arrival date & time 08/19/13  2045 History  This chart was scribed for non-physician practitioner Earley Favor, NP, working with Shanna Cisco, MD by Dorothey Baseman, ED Scribe. This patient was seen in room WTR4/WLPT4 and the patient's care was started at 9:34 PM.    Chief Complaint  Patient presents with  . Medical Clearance    The history is provided by the patient and the spouse (wife). No language interpreter was used.   HPI Comments: Hunter Davis is a 47 y.o. male who presents to the Emergency Department requesting alcohol detox. He reports a long-standing history of alcohol abuse with intermittent periods of sobriety, lasting from 5-8 years, then again for 1 year, but otherwise he states that he drinks alcohol on a near daily basis. Patient and his wife reports he has been drinking wine today and that his last drink was about 1 hour ago. He denies any other substance abuse. He reports history of seizures and DTs that have been attributed to alcohol-withdrawal. Patient states he went through a detox program in May 2014 with 2 months of sobriety afterward. He reports history of HTN and DM, but states he has not taken his medications for several days due to the alcohol consumption.  PCP is Dr. Creta Levin.   Past Medical History  Diagnosis Date  . Asthma   . Alcoholic   . Rectal bleeding   . Hemorrhoids   . Hypertension   . Panic attacks    Past Surgical History  Procedure Laterality Date  . Shoulder surgery      seperated surgery   Family History  Problem Relation Age of Onset  . Diabetes Father   . Hypertension Father   . Hypertension Mother    History  Substance Use Topics  . Smoking status: Former Games developer  . Smokeless tobacco: Not on file  . Alcohol Use: Yes     Comment: "15L per day"    Review of Systems  Constitutional: Negative for fever.  Respiratory: Negative for shortness of breath.   Cardiovascular: Negative for chest pain.   Gastrointestinal: Negative for nausea and vomiting.  Neurological: Negative for dizziness and tremors.  Psychiatric/Behavioral: Positive for suicidal ideas. Negative for sleep disturbance.  All other systems reviewed and are negative.    Allergies  Review of patient's allergies indicates no known allergies.  Home Medications   Current Outpatient Rx  Name  Route  Sig  Dispense  Refill  . albuterol-ipratropium (COMBIVENT) 18-103 MCG/ACT inhaler   Inhalation   Inhale 2 puffs into the lungs every 6 (six) hours as needed for wheezing.         . Dietary Management Product (ENLYTE PO)   Oral   Take 1 capsule by mouth every morning.         Marland Kitchen glimepiride (AMARYL) 4 MG tablet   Oral   Take 4 mg by mouth 2 (two) times daily.         Marland Kitchen losartan-hydrochlorothiazide (HYZAAR) 100-12.5 MG per tablet   Oral   Take 1 tablet by mouth every morning.         . metFORMIN (GLUCOPHAGE) 1000 MG tablet   Oral   Take 1,000 mg by mouth 2 (two) times daily with a meal.         . topiramate (TOPAMAX) 25 MG tablet   Oral   Take 25 mg by mouth 3 (three) times daily.  Triage Vitals: BP 139/85  Pulse 113  Temp(Src) 98.3 F (36.8 C) (Oral)  Resp 16  Ht 5\' 10"  (1.778 m)  Wt 185 lb (83.915 kg)  BMI 26.54 kg/m2  SpO2 94%  Physical Exam  Nursing note and vitals reviewed. Constitutional: He is oriented to person, place, and time. He appears well-developed and well-nourished. No distress.  HENT:  Head: Normocephalic and atraumatic.  Eyes: Pupils are equal, round, and reactive to light.  Neck: Normal range of motion.  Cardiovascular: Normal rate, regular rhythm and normal heart sounds.   Pulmonary/Chest: Effort normal and breath sounds normal.  Musculoskeletal: Normal range of motion.  Neurological: He is alert and oriented to person, place, and time.  Skin: Skin is warm and dry.  Psychiatric: He has a normal mood and affect. His behavior is normal.    ED Course    Medications  LORazepam (ATIVAN) tablet 0-4 mg (not administered)    Followed by  LORazepam (ATIVAN) tablet 0-4 mg (not administered)  thiamine (VITAMIN B-1) tablet 100 mg (not administered)    Or  thiamine (B-1) injection 100 mg (not administered)    DIAGNOSTIC STUDIES: Oxygen Saturation is 94% on room air, adequate by my interpretation.    COORDINATION OF CARE: 9:42PM- Ordered UA drug screen, CBC, and EtOH level. Discussed plan and patient agrees.    Procedures (including critical care time)  Labs Reviewed  CBC - Abnormal; Notable for the following:    Platelets 55 (*)    All other components within normal limits  COMPREHENSIVE METABOLIC PANEL - Abnormal; Notable for the following:    Glucose, Bld 142 (*)    BUN 4 (*)    AST 148 (*)    Alkaline Phosphatase 162 (*)    Total Bilirubin 1.6 (*)    All other components within normal limits  ETHANOL - Abnormal; Notable for the following:    Alcohol, Ethyl (B) 410 (*)    All other components within normal limits  GLUCOSE, CAPILLARY - Abnormal; Notable for the following:    Glucose-Capillary 128 (*)    All other components within normal limits  ETHANOL - Abnormal; Notable for the following:    Alcohol, Ethyl (B) 272 (*)    All other components within normal limits  URINE RAPID DRUG SCREEN (HOSP PERFORMED)   No results found.  No diagnosis found.  MDM   Will start CIWA protocol   I personally performed the services described in this documentation, which was scribed in my presence. The recorded information has been reviewed and is accurate.   Arman Filter, NP 08/20/13 [phone]  Recurrent etoh abuse and withdrawal seizure hx.  Pt has been drinking heavy wine and heavy alcohol past week.  He has felt SI with plan to use CO poisoning.  On exam pt clinically intoxicated.  SI.  Mild shaking with holding arms forward, mild horiz nystagmus, AO3, moves all ext equal, mild exp wheeze.  Pt hungry, asking for meal.  Plan for ACT  to assist with outpt or inpt treatment.     Enid Skeens, MD 08/20/13 [phone]

## 2013-08-19 NOTE — ED Notes (Signed)
Per pt and family - pt w/ extended history of alcohol addiction, pt w/ intermittent periods of sobriety. Pt relapsed to alcohol on 07/27/2013 after 2 months of being sober. Pt states he drinks beer/liquor/wine - pt states "I dink about 15L a day" - pt's last drink was 2030 this evening. Pt now requesting detox again. Pt admits to SI w/ a plan to induce carbon monoxide poisoning in the garage or shooting himself w/ a gun - pt admits to having the means to do so however has not wanted to. Denies HI.

## 2013-08-19 NOTE — ED Notes (Signed)
[place],  [place] [phone]

## 2013-08-20 ENCOUNTER — Inpatient Hospital Stay (HOSPITAL_COMMUNITY)
Admission: AD | Admit: 2013-08-20 | Discharge: 2013-08-25 | DRG: 751 | Disposition: A | Discharge status: Home / Self Care | Payer: BC Managed Care – PPO | Source: Intra-hospital | Attending: Psychiatry | Admitting: Psychiatry

## 2013-08-20 ENCOUNTER — Encounter (HOSPITAL_COMMUNITY): Payer: Self-pay

## 2013-08-20 DIAGNOSIS — J45909 Unspecified asthma, uncomplicated: Secondary | ICD-10-CM | POA: Diagnosis present

## 2013-08-20 DIAGNOSIS — F1020 Alcohol dependence, uncomplicated: Secondary | ICD-10-CM | POA: Diagnosis present

## 2013-08-20 DIAGNOSIS — F410 Panic disorder [episodic paroxysmal anxiety] without agoraphobia: Secondary | ICD-10-CM | POA: Diagnosis present

## 2013-08-20 DIAGNOSIS — Z79899 Other long term (current) drug therapy: Secondary | ICD-10-CM

## 2013-08-20 DIAGNOSIS — E119 Type 2 diabetes mellitus without complications: Secondary | ICD-10-CM | POA: Diagnosis present

## 2013-08-20 DIAGNOSIS — I10 Essential (primary) hypertension: Secondary | ICD-10-CM | POA: Diagnosis present

## 2013-08-20 DIAGNOSIS — F10939 Alcohol use, unspecified with withdrawal, unspecified: Principal | ICD-10-CM | POA: Diagnosis present

## 2013-08-20 DIAGNOSIS — F1010 Alcohol abuse, uncomplicated: Secondary | ICD-10-CM

## 2013-08-20 DIAGNOSIS — F10239 Alcohol dependence with withdrawal, unspecified: Principal | ICD-10-CM | POA: Diagnosis present

## 2013-08-20 HISTORY — DX: Type 2 diabetes mellitus without complications: E11.9

## 2013-08-20 MED ORDER — CHLORDIAZEPOXIDE HCL 25 MG PO CAPS
25.0000 mg | ORAL_CAPSULE | Freq: Three times a day (TID) | ORAL | Status: AC
  Administered 2013-08-22 (×3): 25 mg via ORAL
  Filled 2013-08-20 (×3): qty 1

## 2013-08-20 MED ORDER — ONDANSETRON 4 MG PO TBDP
4.0000 mg | ORAL_TABLET | Freq: Four times a day (QID) | ORAL | Status: AC | PRN
  Administered 2013-08-21: 4 mg via ORAL

## 2013-08-20 MED ORDER — ALUM & MAG HYDROXIDE-SIMETH 200-200-20 MG/5ML PO SUSP: 30.0000 mL | ORAL | Status: DC | PRN

## 2013-08-20 MED ORDER — ACETAMINOPHEN 325 MG PO TABS
650.0000 mg | ORAL_TABLET | Freq: Four times a day (QID) | ORAL | Status: DC | PRN
  Administered 2013-08-21 – 2013-08-24 (×4): 650 mg via ORAL

## 2013-08-20 MED ORDER — CHLORDIAZEPOXIDE HCL 25 MG PO CAPS
25.0000 mg | ORAL_CAPSULE | ORAL | Status: AC
  Administered 2013-08-23 (×2): 25 mg via ORAL
  Filled 2013-08-20 (×2): qty 1

## 2013-08-20 MED ORDER — GLIMEPIRIDE 4 MG PO TABS
4.0000 mg | ORAL_TABLET | Freq: Two times a day (BID) | ORAL | Status: DC
  Administered 2013-08-21 – 2013-08-25 (×9): 4 mg via ORAL
  Filled 2013-08-20 (×4): qty 1
  Filled 2013-08-20: qty 2
  Filled 2013-08-20 (×9): qty 1

## 2013-08-20 MED ORDER — HYDROCORTISONE 1 % EX CREA
TOPICAL_CREAM | Freq: Three times a day (TID) | CUTANEOUS | Status: DC
  Administered 2013-08-20 – 2013-08-24 (×7): via TOPICAL
  Filled 2013-08-20 (×2): qty 28

## 2013-08-20 MED ORDER — CHLORDIAZEPOXIDE HCL 25 MG PO CAPS
25.0000 mg | ORAL_CAPSULE | Freq: Once | ORAL | Status: AC
  Administered 2013-08-20: 25 mg via ORAL
  Filled 2013-08-20: qty 1

## 2013-08-20 MED ORDER — METFORMIN HCL 500 MG PO TABS
1000.0000 mg | ORAL_TABLET | Freq: Two times a day (BID) | ORAL | Status: DC
  Administered 2013-08-21 – 2013-08-25 (×9): 1000 mg via ORAL
  Filled 2013-08-20 (×2): qty 2
  Filled 2013-08-20: qty 1
  Filled 2013-08-20 (×11): qty 2

## 2013-08-20 MED ORDER — CHLORDIAZEPOXIDE HCL 25 MG PO CAPS
25.0000 mg | ORAL_CAPSULE | Freq: Four times a day (QID) | ORAL | Status: AC
  Administered 2013-08-20 – 2013-08-21 (×6): 25 mg via ORAL
  Filled 2013-08-20 (×6): qty 1

## 2013-08-20 MED ORDER — CHLORDIAZEPOXIDE HCL 25 MG PO CAPS
25.0000 mg | ORAL_CAPSULE | Freq: Every day | ORAL | Status: AC
  Administered 2013-08-24: 25 mg via ORAL
  Filled 2013-08-20: qty 1

## 2013-08-20 MED ORDER — IPRATROPIUM-ALBUTEROL 20-100 MCG/ACT IN AERS
2.0000 | INHALATION_SPRAY | Freq: Four times a day (QID) | RESPIRATORY_TRACT | Status: DC | PRN
  Administered 2013-08-21 – 2013-08-25 (×4): 2 via RESPIRATORY_TRACT
  Filled 2013-08-20: qty 4

## 2013-08-20 MED ORDER — CHLORDIAZEPOXIDE HCL 25 MG PO CAPS
25.0000 mg | ORAL_CAPSULE | Freq: Four times a day (QID) | ORAL | Status: AC | PRN
  Administered 2013-08-21 (×2): 25 mg via ORAL
  Filled 2013-08-20 (×2): qty 1

## 2013-08-20 MED ORDER — VITAMIN B-1 100 MG PO TABS
100.0000 mg | ORAL_TABLET | Freq: Every day | ORAL | Status: DC
  Administered 2013-08-21 – 2013-08-25 (×5): 100 mg via ORAL
  Filled 2013-08-20 (×7): qty 1

## 2013-08-20 MED ORDER — NICOTINE 21 MG/24HR TD PT24
21.0000 mg | MEDICATED_PATCH | Freq: Every day | TRANSDERMAL | Status: DC
  Filled 2013-08-20: qty 1

## 2013-08-20 MED ORDER — HYDROCHLOROTHIAZIDE 12.5 MG PO CAPS
12.5000 mg | ORAL_CAPSULE | Freq: Every day | ORAL | Status: DC
  Administered 2013-08-20 – 2013-08-25 (×6): 12.5 mg via ORAL
  Filled 2013-08-20 (×10): qty 1

## 2013-08-20 MED ORDER — MAGNESIUM HYDROXIDE 400 MG/5ML PO SUSP: 30.0000 mL | Freq: Every day | ORAL | Status: DC | PRN

## 2013-08-20 MED ORDER — ADULT MULTIVITAMIN W/MINERALS CH
1.0000 | ORAL_TABLET | Freq: Every day | ORAL | Status: DC
  Administered 2013-08-21 – 2013-08-25 (×5): 1 via ORAL
  Filled 2013-08-20 (×8): qty 1

## 2013-08-20 MED ORDER — LORAZEPAM 1 MG PO TABS: 1.0000 mg | ORAL_TABLET | Freq: Three times a day (TID) | ORAL | Status: DC | PRN

## 2013-08-20 MED ORDER — ONDANSETRON HCL 4 MG PO TABS: 4.0000 mg | ORAL_TABLET | Freq: Three times a day (TID) | ORAL | Status: DC | PRN

## 2013-08-20 MED ORDER — LOPERAMIDE HCL 2 MG PO CAPS: 2.0000 mg | ORAL_CAPSULE | ORAL | Status: AC | PRN

## 2013-08-20 MED ORDER — LORAZEPAM 1 MG PO TABS: 1.0000 mg | ORAL_TABLET | Freq: Four times a day (QID) | ORAL | Status: DC | PRN

## 2013-08-20 MED ORDER — HYDROXYZINE HCL 25 MG PO TABS: 25.0000 mg | ORAL_TABLET | Freq: Four times a day (QID) | ORAL | Status: AC | PRN

## 2013-08-20 MED ORDER — HYDROXYZINE HCL 50 MG PO TABS
50.0000 mg | ORAL_TABLET | Freq: Every evening | ORAL | Status: DC | PRN
  Administered 2013-08-20 – 2013-08-22 (×3): 50 mg via ORAL
  Filled 2013-08-20: qty 14

## 2013-08-20 MED ORDER — ZOLPIDEM TARTRATE 5 MG PO TABS: 5.0000 mg | ORAL_TABLET | Freq: Every evening | ORAL | Status: DC | PRN

## 2013-08-20 MED ORDER — LOSARTAN POTASSIUM 50 MG PO TABS
100.0000 mg | ORAL_TABLET | Freq: Every day | ORAL | Status: DC
  Administered 2013-08-20 – 2013-08-25 (×6): 100 mg via ORAL
  Filled 2013-08-20 (×3): qty 2
  Filled 2013-08-20: qty 1
  Filled 2013-08-20 (×6): qty 2

## 2013-08-20 MED ORDER — IBUPROFEN 200 MG PO TABS
600.0000 mg | ORAL_TABLET | Freq: Three times a day (TID) | ORAL | Status: DC | PRN
  Administered 2013-08-20: 600 mg via ORAL
  Filled 2013-08-20: qty 3

## 2013-08-20 NOTE — BH Assessment (Signed)
Assessment Note  Hunter Davis is an 47 y.o. male.   Pt consumes alcohol daily and has done so for over 3 to 5 months.  Pt longest sobriety was 14 mths in 2012.  Pt relapsed due to argument he had with his brothers.  Pt denies SI, HI and AVH.  Pt has no reported SI attempts or gestures.  Pt reports mild depression.  Pt reports wife is supportive of him and desires for him to stop consuming alcohol.    Pt reports being Ox3 (person, place and situation), could not tell TTS the date or day.  "I went to those AA meetings and just got tired of going 2 an 3x times per day.  I saw a sponsor once intoxicated out in the community.  Who needs that."  Pt asked about inptx for detox.  When explained the programming of most detox facilities pt responded "I don't like groups.  I am shy and I won't say anything.  I don't need a psychiatrist."    Pt sat on the edge of his bed.  Pt appeared to be casual and in no discomfort.  Pt looked TTS in the eye throughout assessment.  He said at the end, "I don't want to do anything but go home.  I am not ready or need to do something else but group and being a a facility is not for me."  TTS again went through the benefits of treatment and the risk of trying to do so on his own. Pt acknowledged verbally he understood.  "I don't plan on detox on my own, I'm just not ready."  TTS went back through safety questions and pt continued to deny any interest, thoughts, plan or intent on harming self.  There is no evidence in previous notes contradicting pt report.  TTS staffed with PA and she went and evaluated pt further.  Dispo will be forthcoming by MD.  Support paper work to [employer], ADS provided.  Pt already familiar with AA process.  Axis I: alcohol dependency Axis II: Deferred Axis III:  Past Medical History  Diagnosis Date  . Asthma   . Alcoholic   . Rectal bleeding   . Hemorrhoids   . Hypertension   . Panic attacks    Axis IV: other psychosocial or  environmental problems and problems related to social environment Axis V: 51-60 moderate symptoms  Past Medical History:  Past Medical History  Diagnosis Date  . Asthma   . Alcoholic   . Rectal bleeding   . Hemorrhoids   . Hypertension   . Panic attacks     Past Surgical History  Procedure Laterality Date  . Shoulder surgery      seperated surgery    Family History:  Family History  Problem Relation Age of Onset  . Diabetes Father   . Hypertension Father   . Hypertension Mother     Social History:  reports that he has quit smoking. He does not have any smokeless tobacco history on file. He reports that  drinks alcohol. He reports that he does not use illicit drugs.  Additional Social History:  Alcohol / Drug Use Pain Medications: na Prescriptions: na Over the Counter: na History of alcohol / drug use?: Yes Substance #1 Name of Substance 1: alcohol 1 - Age of First Use: 20s 1 - Amount (size/oz): case of beer or 2 bottles of wine 1 - Frequency: daily 1 - Duration: 5 mths 1 - Last Use / Amount:  08-20-13  CIWA: CIWA-Ar BP: 148/84 mmHg Pulse Rate: 106 Nausea and Vomiting: mild nausea with no vomiting Tactile Disturbances: none Tremor: not visible, but can be felt fingertip to fingertip Auditory Disturbances: not present Paroxysmal Sweats: barely perceptible sweating, palms moist Visual Disturbances: not present Anxiety: two Headache, Fullness in Head: none present Agitation: two Orientation and Clouding of Sensorium: oriented and can do serial additions CIWA-Ar Total: 7 COWS:    Allergies: No Known Allergies  Home Medications:  (Not in a hospital admission)  OB/GYN Status:  No LMP for male patient.  General Assessment Data Location of Assessment: [facility] ED Is this a Tele or Face-to-Face Assessment?: Face-to-Face Is this an Initial Assessment or a Re-assessment for this encounter?: Initial Assessment Living Arrangements: Spouse/significant other Can pt  return to current living arrangement?: Yes Admission Status: Voluntary Is patient capable of signing voluntary admission?: Yes Transfer from: Acute Hospital Referral Source: MD  Medical Screening Exam [facility] Walk-in ONLY) Medical Exam completed: Yes  [facility] Crisis Care Plan Living Arrangements: Spouse/significant other  Education Status Is patient currently in school?: No  Risk to self Suicidal Ideation: No Suicidal Intent: No Is patient at risk for suicide?: No Suicidal Plan?: No Access to Means: No What has been your use of drugs/alcohol within the last 12 months?: alcohol Previous Attempts/Gestures: No How many times?: 0 Other Self Harm Risks: na Triggers for Past Attempts: None known Intentional Self Injurious Behavior: None Family Suicide History: No Recent stressful life event(s): Job Loss;Financial Problems (issues with brothers) Persecutory voices/beliefs?: No Depression: Yes Depression Symptoms: Fatigue;Loss of interest in usual pleasures Substance abuse history and/or treatment for substance abuse?: Yes Suicide prevention information given to non-admitted patients: Yes  Risk to Others Homicidal Ideation: No Thoughts of Harm to Others: No Current Homicidal Intent: No Current Homicidal Plan: No Access to Homicidal Means: No Identified Victim: na History of harm to others?: No Assessment of Violence: None Noted Violent Behavior Description: cooperative Does patient have access to weapons?: No Criminal Charges Pending?: No Does patient have a court date: No  Psychosis Hallucinations: None noted Delusions: None noted  Mental Status Report Appear/Hygiene: Disheveled Eye Contact: Good Motor Activity: Unremarkable Speech: Logical/coherent Level of Consciousness: Alert Mood: Depressed;Ashamed/humiliated Affect: Appropriate to circumstance;Depressed Anxiety Level: None Thought Processes: Coherent Judgement: Unimpaired Orientation:  Person;Place;Situation Obsessive Compulsive Thoughts/Behaviors: None  Cognitive Functioning Concentration: Decreased Memory: Recent Impaired;Remote Intact IQ: Average Insight: Fair Impulse Control: Fair Appetite: Fair Weight Loss: 0 Weight Gain: 0 Sleep: Increased Total Hours of Sleep: 12 Vegetative Symptoms: None  ADLScreening [facility] Assessment Services) Patient's cognitive ability adequate to safely complete daily activities?: Yes Patient able to express need for assistance with ADLs?: Yes Independently performs ADLs?: Yes (appropriate for developmental age)  Prior Inpatient Therapy Prior Inpatient Therapy: Yes Prior Therapy Dates: 2012 Prior Therapy Facilty/Provider(s): [facility] Reason for Treatment: rehab  Prior Outpatient Therapy Prior Outpatient Therapy: Yes Prior Therapy Dates: 2013 Prior Therapy Facilty/Provider(s): [facility] Reason for Treatment: sobriety  ADL Screening (condition at time of admission) Patient's cognitive ability adequate to safely complete daily activities?: Yes Is the patient deaf or have difficulty hearing?: No Does the patient have difficulty seeing, even when wearing glasses/contacts?: No Does the patient have difficulty concentrating, remembering, or making decisions?: No Patient able to express need for assistance with ADLs?: Yes Does the patient have difficulty dressing or bathing?: No Independently performs ADLs?: Yes (appropriate for developmental age) Does the patient have difficulty walking or climbing stairs?: No Weakness of Legs: None Weakness of  Arms/Hands: None  Home Assistive Devices/Equipment Home Assistive Devices/Equipment: None  Therapy Consults (therapy consults require a physician order) PT Evaluation Needed: No OT Evalulation Needed: No SLP Evaluation Needed: No Abuse/Neglect Assessment (Assessment to be complete while patient is alone) Physical Abuse: Denies Verbal Abuse: Denies Sexual Abuse: Denies Exploitation  of patient/patient's resources: Denies Self-Neglect: Denies Values / Beliefs Cultural Requests During Hospitalization: None Spiritual Requests During Hospitalization: None Consults Spiritual Care Consult Needed: No Social Work Consult Needed: No Merchant navy officer (For Healthcare) Advance Directive: Patient does not have advance directive Pre-existing out of facility DNR order (yellow form or pink MOST form): No    Additional Information 1:1 In Past 12 Months?: No CIRT Risk: No Elopement Risk: No Does patient have medical clearance?: Yes (MD will determine)     Disposition:  Disposition Initial Assessment Completed for this Encounter: Yes Disposition of Patient: Referred to (referred to RTS, AA, ADS) Patient referred to: Other (Comment) (AA, ADS, [facility])  On Site Evaluation by:   Reviewed with Physician:    Macon Large 08/20/2013 8:47 AM

## 2013-08-20 NOTE — ED Notes (Signed)
Notified by  ACT, Reita Cliche, that pt is to be d/c home and to follow up with outpatient service referrals.

## 2013-08-20 NOTE — Progress Notes (Signed)
Patient ID: Hunter Davis, male   DOB: 1966-03-18, 47 y.o.   MRN: [id number]  Admission Note:  D:47 yr male who presents [facility] in no acute distress for ETOH DETOX. Pt appears flat and depressed. Pt was calm and cooperative with admission process. Pt  Denies SI/HI/AVH. Pt endorses pain in lower back, RN made aware. Pt had SI with a plan to shoot self or use Carbon monoxide poisoning in garage, but denies at this time.    A:Pt has Past medical Hx of Asthma, Depression and  Panic attacks, HTN, and Diabetes that he takes Metforman . Pt  has CPAP, placed in nurses station. His CBG was 142 at ED.   Skin was assessed and found to be clear of any abnormal marks apart from rash on back and head area. POC and unit policies explained and understanding verbalized. Consents obtained.    R:Pt had no additional questions or concerns.

## 2013-08-20 NOTE — ED Notes (Signed)
Hunter Davis, ACT called and stated that pt has a bed at [facility], not to move to psych at this time.

## 2013-08-20 NOTE — ED Notes (Addendum)
Pt now c/o SOB and CP. Pt states that he "always feel like this when I don't drink, drinking makes me breathe better."Irving Burton, PA notified of HR, sats and CP. Pt given Ativan per order. Pt has hx of asthma

## 2013-08-20 NOTE — Consult Note (Signed)
[facility] Psychiatry Consult   Reason for Consult:  Alcohol withdrawal Referring Physician:  EDP Hunter Davis is an 47 y.o. male.  Assessment: AXIS I:  Alcohol Abuse AXIS II:  Deferred AXIS III:   Past Medical History  Diagnosis Date  . Asthma   . Alcoholic   . Rectal bleeding   . Hemorrhoids   . Hypertension   . Panic attacks    AXIS IV:  occupational problems, other psychosocial or environmental problems and problems related to social environment AXIS V:  51-60 moderate symptoms  Plan:  Recommend psychiatric Inpatient admission when medically cleared.  Subjective:   Hunter Davis is a 47 y.o. male patient admitted with Alcohol withdrawal.  HPI:  Evaluated this Caucasian male 47 years old who is seek detoxification from alcohol. Pt consumes alcohol daily and has done so for over 3 to 5 months.  He drinks 1/2 gallon of liquor  or 5 liters of wine daily. Pt longest sobriety was 14 mths in 2012. Pt relapsed due to argument he had with his brothers. Pt denies SI, HI and AVH. Pt has no reported SI attempts or gestures. Pt reports mild depression and states he drinks to self treat his depression. Pt reports he drank too much this week when his wife travelled out for her job.    He also reports that  wife is supportive of him and desires for him to stop consuming alcohol.   He reports previous  detox here, [place] and at another facility.  He reports poor sleep and that he drinks a times to help him sleep and that his appetite varies from good to poor.  He states he will like his depression addressed while he is receiving treatment for alcoholism.  His alcohol level on arrival to the ER was 410 while his UDS is negative.  We admit him to our inpatient Chemical dependency unit.       Past Psychiatric History: Past Medical History  Diagnosis Date  . Asthma   . Alcoholic   . Rectal bleeding   . Hemorrhoids   . Hypertension   . Panic attacks     reports that he has quit smoking. He  does not have any smokeless tobacco history on file. He reports that  drinks alcohol. He reports that he does not use illicit drugs. Family History  Problem Relation Age of Onset  . Diabetes Father   . Hypertension Father   . Hypertension Mother    Family History Substance Abuse: No Family Supports: Yes, List: (wife) Living Arrangements: Spouse/significant other Can pt return to current living arrangement?: Yes Abuse/Neglect [facility]) Physical Abuse: Denies Verbal Abuse: Denies Sexual Abuse: Denies Allergies:  No Known Allergies  Past Psychiatric History: Diagnosis:  Alcohol dependence/withdrawal  Hospitalizations:  Yes x2  Outpatient Care:  none  Substance Abuse Care:  denies  Self-Mutilation:  denies  Suicidal Attempts:  denies  Violent Behaviors:  denies   Objective: Blood pressure 155/91, pulse 117, temperature 98.5 F (36.9 C), temperature source Oral, resp. rate 20, height 5\' 10"  (1.778 m), weight 83.915 kg (185 lb), SpO2 94.00%.Body mass index is 26.54 kg/(m^2). Results for orders placed during the hospital encounter of 08/19/13 (from the past 72 hour(s))  GLUCOSE, CAPILLARY     Status: Abnormal   Collection Time    08/19/13  9:53 PM      Result Value Range   Glucose-Capillary 128 (*) 70 - 99 mg/dL  CBC     Status: Abnormal  Collection Time    08/19/13 10:15 PM      Result Value Range   WBC 4.6  4.0 - 10.5 K/uL   RBC 4.38  4.22 - 5.81 MIL/uL   Hemoglobin 14.8  13.0 - 17.0 g/dL   HCT 04.5  40.9 - 81.1 %   MCV 96.8  78.0 - 100.0 fL   MCH 33.8  26.0 - 34.0 pg   MCHC 34.9  30.0 - 36.0 g/dL   RDW 91.4  78.2 - 95.6 %   Platelets 55 (*) 150 - 400 K/uL   Comment: REPEATED TO VERIFY     SPECIMEN CHECKED FOR CLOTS     PLATELET COUNT CONFIRMED BY SMEAR  COMPREHENSIVE METABOLIC PANEL     Status: Abnormal   Collection Time    08/19/13 10:15 PM      Result Value Range   Sodium 145  135 - 145 mEq/L   Potassium 3.9  3.5 - 5.1 mEq/L   Chloride 107  96 - 112 mEq/L   CO2  29  19 - 32 mEq/L   Glucose, Bld 142 (*) 70 - 99 mg/dL   BUN 4 (*) 6 - 23 mg/dL   Creatinine, Ser 2.13  0.50 - 1.35 mg/dL   Calcium 8.5  8.4 - 08.6 mg/dL   Total Protein 8.1  6.0 - 8.3 g/dL   Albumin 3.7  3.5 - 5.2 g/dL   AST 578 (*) 0 - 37 U/L   ALT 47  0 - 53 U/L   Alkaline Phosphatase 162 (*) 39 - 117 U/L   Total Bilirubin 1.6 (*) 0.3 - 1.2 mg/dL   GFR calc non Af Amer >90  >90 mL/min   GFR calc Af Amer >90  >90 mL/min   Comment: (NOTE)     The eGFR has been calculated using the CKD EPI equation.     This calculation has not been validated in all clinical situations.     eGFR's persistently <90 mL/min signify possible Chronic Kidney     Disease.  Hunter Davis     Status: Abnormal   Collection Time    08/19/13 10:15 PM      Result Value Range   Alcohol, Ethyl (B) 410 (*) 0 - 11 mg/dL   Comment:            LOWEST DETECTABLE LIMIT FOR     SERUM ALCOHOL IS 11 mg/dL     FOR MEDICAL PURPOSES ONLY     CRITICAL RESULT CALLED TO, READ BACK BY AND VERIFIED WITH:     A SHELL RN [id number] 08/19/13 A NAVARRO  URINE RAPID DRUG SCREEN (HOSP PERFORMED)     Status: None   Collection Time    08/19/13 10:37 PM      Result Value Range   Opiates NONE DETECTED  NONE DETECTED   Cocaine NONE DETECTED  NONE DETECTED   Benzodiazepines NONE DETECTED  NONE DETECTED   Amphetamines NONE DETECTED  NONE DETECTED   Tetrahydrocannabinol NONE DETECTED  NONE DETECTED   Barbiturates NONE DETECTED  NONE DETECTED   Comment:            DRUG SCREEN FOR MEDICAL PURPOSES     ONLY.  IF CONFIRMATION IS NEEDED     FOR ANY PURPOSE, NOTIFY LAB     WITHIN 5 DAYS.                LOWEST DETECTABLE LIMITS     FOR URINE DRUG SCREEN  Drug Class       Cutoff (ng/mL)     Amphetamine      1000     Barbiturate      200     Benzodiazepine   200     Tricyclics       300     Opiates          300     Cocaine          300     THC              50  Hunter Davis     Status: Abnormal   Collection Time    08/20/13  5:51 AM      Result  Value Range   Alcohol, Ethyl (B) 272 (*) 0 - 11 mg/dL   Comment:            LOWEST DETECTABLE LIMIT FOR     SERUM ALCOHOL IS 11 mg/dL     FOR MEDICAL PURPOSES ONLY   Labs are reviewed and are pertinent for  Alcohol level 410 .  Current Facility-Administered Medications  Medication Dose Route Frequency Provider Last Rate Last Dose  . alum & mag hydroxide-simeth (MAALOX/MYLANTA) 200-200-20 MG/5ML suspension 30 mL  30 mL Oral PRN Trixie Dredge, PA-C      . ibuprofen (ADVIL,MOTRIN) tablet 600 mg  600 mg Oral Q8H PRN Trixie Dredge, PA-C   600 mg at 08/20/13 0732  . LORazepam (ATIVAN) tablet 0-4 mg  0-4 mg Oral Q6H Arman Filter, NP   1 mg at 08/20/13 0932   Followed by  . [START ON 08/22/2013] LORazepam (ATIVAN) tablet 0-4 mg  0-4 mg Oral Q12H Arman Filter, NP      . LORazepam (ATIVAN) tablet 1 mg  1 mg Oral Q8H PRN Trixie Dredge, PA-C      . ondansetron [facility]) tablet 4 mg  4 mg Oral Q8H PRN Trixie Dredge, PA-C      . thiamine (VITAMIN B-1) tablet 100 mg  100 mg Oral Daily Arman Filter, NP   100 mg at 08/20/13 1015   Or  . thiamine (B-1) injection 100 mg  100 mg Intravenous Daily Arman Filter, NP      . zolpidem (AMBIEN) tablet 5 mg  5 mg Oral QHS PRN Trixie Dredge, PA-C       Current Outpatient Prescriptions  Medication Sig Dispense Refill  . albuterol-ipratropium (COMBIVENT) 18-103 MCG/ACT inhaler Inhale 2 puffs into the lungs every 6 (six) hours as needed for wheezing.      . Dietary Management Product (ENLYTE PO) Take 1 capsule by mouth every morning.      Marland Kitchen glimepiride (AMARYL) 4 MG tablet Take 4 mg by mouth 2 (two) times daily.      Marland Kitchen losartan-hydrochlorothiazide (HYZAAR) 100-12.5 MG per tablet Take 1 tablet by mouth every morning.      . metFORMIN (GLUCOPHAGE) 1000 MG tablet Take 1,000 mg by mouth 2 (two) times daily with a meal.      . topiramate (TOPAMAX) 25 MG tablet Take 25 mg by mouth 3 (three) times daily.      Marland Kitchen LORazepam (ATIVAN) 1 MG tablet Take 1 tablet (1 mg total) by mouth every 6  (six) hours as needed for anxiety (or mild withdrawal symptoms).  10 tablet  0    Psychiatric Specialty Exam:     Blood pressure 155/91, pulse 117, temperature 98.5 F (36.9 C), temperature source Oral, resp. rate  20, height 5\' 10"  (1.778 m), weight 83.915 kg (185 lb), SpO2 94.00%.Body mass index is 26.54 kg/(m^2).  General Appearance: Casual  Eye Contact::  Fair  Speech:  Garbled and Normal Rate  Volume:  Decreased  Mood:  Anxious, Depressed, Hopeless and Worthless  Affect:  Appropriate and Congruent  Thought Process:  Coherent  Orientation:  Full (Time, Place, and Person)  Thought Content:  NA  Suicidal Thoughts:  No  Homicidal Thoughts:  No  Memory:  Immediate;   Fair Recent;   Fair Remote;   Fair  Judgement:  Poor  Insight:  Present  Psychomotor Activity:  Normal  Concentration:  Fair  Recall:  Fair  Akathisia:  NA  Handed:  Right  AIMS (if indicated):     Assets:  Desire for Improvement  Sleep:      Treatment Plan Summary: Consult and face to face interview with Dr Alta Corning We will admit patient to our chemical dependence unit and we will use our Ativan detox protocol We will address his depression while he is in our unit We will also take care of his medical need especially his BP. Daily contact with patient to assess and evaluate symptoms and progress in treatment Medication management  Dahlia Byes, C 08/20/2013 11:59 AM  I have personally seen the patient and agreed with the findings and involved in the treatment plan. Thresa Ross, MD

## 2013-08-20 NOTE — ED Provider Notes (Signed)
6:19 AM Assumed care of patient from Earley Favor, NP, at change of shift.  Patient is a chronic alcoholic presenting for alcohol detox.  ETOH was 410 on arrival.  He is on CIWA protocol as he has a hx of ETOH withdrawal seizures.  Plan is for redraw of ETOH, when less than 200 he is to be assessed for detox program.   7:22 AM Patient is currently awake, alert, lucid.  Confirms that he wants detox.  States he is shaky - I have asked nurse to reassess with CIWA protocol.  Discussed patient with Reita Cliche (ACT team) who will see and assess patient.    8:35 AM Patient has been seen by Reita Cliche to discuss options for detox. Patient now declining detox.  I have confirmed this with him.  Pt does have some depression but has denied SI.  Plan is for d/c home.    Results for orders placed during the hospital encounter of 08/19/13  CBC      Result Value Range   WBC 4.6  4.0 - 10.5 K/uL   RBC 4.38  4.22 - 5.81 MIL/uL   Hemoglobin 14.8  13.0 - 17.0 g/dL   HCT 78.2  95.6 - 21.3 %   MCV 96.8  78.0 - 100.0 fL   MCH 33.8  26.0 - 34.0 pg   MCHC 34.9  30.0 - 36.0 g/dL   RDW 08.6  57.8 - 46.9 %   Platelets 55 (*) 150 - 400 K/uL  COMPREHENSIVE METABOLIC PANEL      Result Value Range   Sodium 145  135 - 145 mEq/L   Potassium 3.9  3.5 - 5.1 mEq/L   Chloride 107  96 - 112 mEq/L   CO2 29  19 - 32 mEq/L   Glucose, Bld 142 (*) 70 - 99 mg/dL   BUN 4 (*) 6 - 23 mg/dL   Creatinine, Ser 6.29  0.50 - 1.35 mg/dL   Calcium 8.5  8.4 - 52.8 mg/dL   Total Protein 8.1  6.0 - 8.3 g/dL   Albumin 3.7  3.5 - 5.2 g/dL   AST 413 (*) 0 - 37 U/L   ALT 47  0 - 53 U/L   Alkaline Phosphatase 162 (*) 39 - 117 U/L   Total Bilirubin 1.6 (*) 0.3 - 1.2 mg/dL   GFR calc non Af Amer >90  >90 mL/min   GFR calc Af Amer >90  >90 mL/min  ETHANOL      Result Value Range   Alcohol, Ethyl (B) 410 (*) 0 - 11 mg/dL  URINE RAPID DRUG SCREEN (HOSP PERFORMED)      Result Value Range   Opiates NONE DETECTED  NONE DETECTED   Cocaine NONE DETECTED   NONE DETECTED   Benzodiazepines NONE DETECTED  NONE DETECTED   Amphetamines NONE DETECTED  NONE DETECTED   Tetrahydrocannabinol NONE DETECTED  NONE DETECTED   Barbiturates NONE DETECTED  NONE DETECTED  GLUCOSE, CAPILLARY      Result Value Range   Glucose-Capillary 128 (*) 70 - 99 mg/dL  ETHANOL      Result Value Range   Alcohol, Ethyl (B) 272 (*) 0 - 11 mg/dL   No results found.    Trixie Dredge, PA-C 08/20/13 2440   9:58 AM Patient's wife is now in the room and has convinced patient to do detox.  Starting to have mild withdrawal symptoms (pain all over, heart racing, anxious). States he does want to get better and would like to  do inpatient detox now.  I have notified Bobby from ACT team.  Pt remains on CIWA protocol.    Patient admitted to behavioral health.    Trixie Dredge, PA-C 08/20/13 1528

## 2013-08-20 NOTE — ED Notes (Signed)
Pt belongings placed in locker 41. 

## 2013-08-20 NOTE — [facility] Counselor (Signed)
Pt accepted to [facility] pending bed (not available) by Julieanne Cotton NP.  [facility] will let TTS know when pt can be transferred.

## 2013-08-20 NOTE — ED Notes (Signed)
CBG 183 

## 2013-08-20 NOTE — ED Notes (Signed)
Pt states that he no longer smokes and use for Nicotine patch

## 2013-08-20 NOTE — Progress Notes (Signed)
D.  Pt's BP was high at 182/102 when writer's shift began so doctor on call notified and order received for Pt's home BP medication.  Pt pleasant on approach but not feeling well.  Some s/s of withdrawal noted.  Did not attend evening AA group due to not feeling well.  Interacting appropriately within milieu.  C-Pap in place per order for nighttime.  Denies SI/HI/hallucinations at this time.  A.  Support and encouragement offered, medication given as ordered  R.  Pt remains safe on unit, will continue to monitor.

## 2013-08-20 NOTE — Progress Notes (Signed)
Patient did not attend the evening speaker AA meeting. Pt remained in bed during group.   

## 2013-08-20 NOTE — BHH Counselor (Signed)
Pt has changed his mind and wants detox.  MD in ER recommends for detox.  Pt reports "I was confused when counselor was here.  I want, no I need the help."

## 2013-08-20 NOTE — Tx Team (Signed)
Initial Interdisciplinary Treatment Plan  PATIENT STRENGTHS: (choose at least two) General fund of knowledge Supportive family/friends  PATIENT STRESSORS: Health problems Medication change or noncompliance Substance abuse   PROBLEM LIST: Problem List/Patient Goals Date to be addressed Date deferred Reason deferred Estimated date of resolution  Etoh DETOX 08/20/13     Panic Attacks 08/20/13     Risk for suicide 08/20/13                                          DISCHARGE CRITERIA:  Withdrawal symptoms are absent or subacute and managed without 24-hour nursing intervention  PRELIMINARY DISCHARGE PLAN: Attend aftercare/continuing care group Attend PHP/IOP  PATIENT/FAMIILY INVOLVEMENT: This treatment plan has been presented to and reviewed with the patient, Hunter Davis.  The patient and family have been given the opportunity to ask questions and make suggestions.  Delos Haring 08/20/2013, 4:42 PM

## 2013-08-20 NOTE — ED Notes (Signed)
Pt d/c cancelled d/t VS and pt generalized pain and SOB per [place], [place]. ACT team being notified that pt wants to stay for inpatient tx. Pt wife at bedside

## 2013-08-21 ENCOUNTER — Encounter (HOSPITAL_COMMUNITY): Payer: Self-pay | Admitting: Psychiatry

## 2013-08-21 DIAGNOSIS — F1020 Alcohol dependence, uncomplicated: Secondary | ICD-10-CM | POA: Diagnosis present

## 2013-08-21 DIAGNOSIS — F10939 Alcohol use, unspecified with withdrawal, unspecified: Principal | ICD-10-CM | POA: Diagnosis present

## 2013-08-21 DIAGNOSIS — F411 Generalized anxiety disorder: Secondary | ICD-10-CM

## 2013-08-21 DIAGNOSIS — F329 Major depressive disorder, single episode, unspecified: Secondary | ICD-10-CM

## 2013-08-21 DIAGNOSIS — F10239 Alcohol dependence with withdrawal, unspecified: Principal | ICD-10-CM | POA: Diagnosis present

## 2013-08-21 DIAGNOSIS — F1994 Other psychoactive substance use, unspecified with psychoactive substance-induced mood disorder: Secondary | ICD-10-CM

## 2013-08-21 LAB — GLUCOSE, CAPILLARY: Glucose-Capillary: 99 mg/dL (ref 70–99)

## 2013-08-21 NOTE — Progress Notes (Signed)
Patient did attend the evening speaker AA meeting.  

## 2013-08-21 NOTE — [facility] Group Notes (Signed)
[facility] Group Notes:  (Clinical Social Work)  08/21/2013  10:00-11:00AM  Summary of Progress/Problems:   The main focus of today's process group was to   identify the patient's current support system and decide on other supports that can be put in place.  The picture on workbook was used to discuss why additional supports are needed, and a hand-out was distributed with four definitions/levels of support, then used to talk about how patients have given and received all different kinds of support.  An emphasis was placed on using counselor, doctor, therapy groups, 12-step groups, and problem-specific support groups to expand supports.  The patient identified one additional support as being trying AA again, perhaps researching other chapters and meetings.  He originally said his wife and mother-in-law are supportive, but he feels that his biological family is humiliated by his disease.  At the end of group, he stated he wants to reach out to his parents.  He has been disillusioned by seeing people at AA who are drunk.  Type of Therapy:  Process Group with Motivational Interviewing  Participation Level:  Minimal  Participation Quality:  Attentive  Affect:  Blunted and Depressed  Cognitive:  Oriented  Insight:  Engaged  Engagement in Therapy:  Engaged  Modes of Intervention:   Education, Support and Processing, Activity  [employer], LCSW 08/21/2013, 1:04 PM

## 2013-08-21 NOTE — Progress Notes (Signed)
D   Pt is sad and depressed   He attends and participates in groups and interacts well with others   He reports he was not depressed until after he had been using recreationally for a while   He verbalizes good insight into his illness   He reports minimal signs and symptoms of withdrawal    He states rash is some better A   Verbal support given  Medications administered and effectiveness monitored   Q 15 min checks R   Pt safe at present

## 2013-08-21 NOTE — [facility] Suicide Risk Assessment (Signed)
Suicide Risk Assessment  Admission Assessment     Nursing information obtained from:  Patient Demographic factors:  Male;Unemployed Current Mental Status:  NA Loss Factors:  Decline in physical health;Financial problems / change in socioeconomic status Historical Factors:  NA Risk Reduction Factors:  Living with another person, especially a relative;Positive social support  CLINICAL FACTORS:   Panic Attacks Depression:   Comorbid alcohol abuse/dependence Alcohol/Substance Abuse/Dependencies  COGNITIVE FEATURES THAT CONTRIBUTE TO RISK:  Thought constriction (tunnel vision)    SUICIDE RISK:   Moderate:  Frequent suicidal ideation with limited intensity, and duration, some specificity in terms of plans, no associated intent, good self-control, limited dysphoria/symptomatology, some risk factors present, and identifiable protective factors, including available and accessible social support.  PLAN OF CARE: Supportive approach/coping skills/relapse prevention                               [facility] Det[facility]                               Reassess and address the co morbidities  I certify that inpatient services furnished can reasonably be expected to improve the patient's condition.  Tamara Monteith A 08/21/2013, 9:16 AM

## 2013-08-21 NOTE — Progress Notes (Signed)
Adult Psychoeducational Group Note  Date:  08/21/2013 Time:  0900  Group Topic/Focus:  Self Inventory  Participation Level:  Did Not Attend   Barbette Merino Destenie Ingber Shari Prows 08/21/2013, 10:25 AM

## 2013-08-21 NOTE — ED Provider Notes (Signed)
Medical screening examination/treatment/procedure(s) were performed by non-physician practitioner and as supervising physician I was immediately available for consultation/collaboration.  Enid Skeens, MD 08/21/13 [phone]

## 2013-08-21 NOTE — Progress Notes (Signed)
Psychoeducational Group Note  Date:  08/21/2013 Time:  0945 am  Group Topic/Focus:  Making Healthy Choices:   The focus of this group is to help patients identify negative/unhealthy choices they were using prior to admission and identify positive/healthier coping strategies to replace them upon discharge.  Participation Level:  Active  Participation Quality:  Appropriate and Attentive  Affect:  Appropriate  Cognitive:  Alert and Appropriate  Insight:  Developing/Improving  Engagement in Group:  Developing/Improving  Additional Comments:    Hunter Davis J 08/21/2013, 10:29 AM 

## 2013-08-21 NOTE — Progress Notes (Signed)
D.  Pt pleasant on approach, positive for evening AA group.  Interacting appropriately within milieu.  Denies SI/HI/hallucinations at this time.  No complaints voiced.  Minimal s/s of withdrawal at this time.  A.  Support and encouragement offered  R.  Will continue to monitor, Pt remains safe on unit.

## 2013-08-21 NOTE — H&P (Addendum)
Psychiatric Admission Assessment Adult  Patient Identification:  Hunter Davis Date of Evaluation:  08/21/2013 Chief Complaint:  ETOH ABUSE History of Present Illness:: 47 Y/O male who comes requesting help with his alcochol absue After he went to [facility] he was sober for a year and two months. Relapsed on Memorial Day 2013. States he was unemployed three and a half years. Tried to be self employed, things spiraled down. 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 Has done projects for [employer], children gardens, not consistent. Financially debt free did a lot of savings. 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 Has been feeling increasingly more depressed, anxious, overwhelmed Elements:  Location:  in patient. Quality:  unable to function. Severity:  severe. Timing:  every day. Duration:  relapsed 15 months ago. Context:  alcohol dependent unable to abstain long term. Associated Signs/Synptoms: Depression Symptoms:  depressed mood, anhedonia, fatigue, difficulty concentrating, anxiety, panic attacks, loss of energy/fatigue, disturbed sleep, weight loss, decreased appetite, (Hypo) Manic Symptoms:  Denies Anxiety Symptoms:  Excessive Worry, Panic Symptoms, Psychotic Symptoms:  denies PTSD Symptoms: NA  Psychiatric Specialty Exam: Physical Exam  ROS  Blood pressure 165/113, pulse 106, temperature 98.3 F (36.8 C), temperature source Oral, resp. rate 24, height 5\' 7"  (1.702 m), weight 83.008 kg (183 lb).Body mass index is 28.66 kg/(m^2).  General Appearance: Disheveled  Eye Solicitor::  Fair  Speech:  Clear and Coherent  Volume:  Decreased  Mood:  Anxious and Depressed  Affect:  Restricted  Thought Process:  Coherent and Goal Directed  Orientation:  Full (Time, Place, and Person)  Thought Content:  worries, concerns  Suicidal Thoughts:  No   Homicidal Thoughts:  No  Memory:  Immediate;   Fair Recent;   Fair Remote;   Fair  Judgement:  Fair  Insight:  Present and Shallow  Psychomotor Activity:  Restlessness  Concentration:  Fair  Recall:  Fair  Akathisia:  No  Handed:  Right  AIMS (if indicated):     Assets:  Desire for Improvement  Sleep:  Number of Hours: 5    Past Psychiatric History: Diagnosis: Alcohol Dependence  Hospitalizations: [facility] (6 weeks ago) [facility],   Outpatient Care: Denies  Substance Abuse Care: [facility]  Self-Mutilation: Denies  Suicidal Attempts: Denies  Violent Behaviors: Denies   Past Medical History:   Past Medical History  Diagnosis Date  . Asthma   . Alcoholic   . Rectal bleeding   . Hemorrhoids   . Hypertension   . Panic attacks   . Diabetes mellitus without complication    Loss of Consciousness:  Falls Seizure History:  etiology unknown Traumatic Brain Injury:  falls Allergies:  No Known Allergies PTA Medications: Prescriptions prior to admission  Medication Sig Dispense Refill  . albuterol-ipratropium (COMBIVENT) 18-103 MCG/ACT inhaler Inhale 2 puffs into the lungs every 6 (six) hours as needed for wheezing.      . Dietary Management Product (ENLYTE PO) Take 1 capsule by mouth every morning.      Marland Kitchen glimepiride (AMARYL) 4 MG tablet Take 4 mg by mouth 2 (two) times daily.      Marland Kitchen LORazepam (ATIVAN) 1 MG tablet Take 1 tablet (1 mg total) by mouth every 6 (six) hours as needed for anxiety (or mild withdrawal symptoms).  10 tablet  0  . losartan-hydrochlorothiazide (  HYZAAR) 100-12.5 MG per tablet Take 1 tablet by mouth every morning.      . metFORMIN (GLUCOPHAGE) 1000 MG tablet Take 1,000 mg by mouth 2 (two) times daily with a meal.      . topiramate (TOPAMAX) 25 MG tablet Take 25 mg by mouth 3 (three) times daily.        Previous Psychotropic Medications:  Medication/Dose  Denies               Substance Abuse History in the last 12 months:   yes  Consequences of Substance Abuse: Blackouts:   Withdrawal Symptoms:   Diaphoresis Diarrhea Headaches Nausea Tremors  Social History:  reports that he has quit smoking. His smoking use included Cigarettes. He smoked 0.00 packs per day. He does not have any smokeless tobacco history on file. He reports that  drinks alcohol. He reports that he does not use illicit drugs. Additional Social History:                      Current Place of Residence:  Lives with wife (since 1999) she works part time Scientist, research (physical sciences) of Birth:   Family Members: Marital Status:  Married Children: denies  Sons:  Daughters: Relationships: Education:  some college Educational Problems/Performance: Religious Beliefs/Practices: Not anymore History of Abuse (Emotional/Phsycial/Sexual) Denies Armed forces technical officer; Transport planner History:  None. Legal History: Denies Hobbies/Interests:  Family History:   Family History  Problem Relation Age of Onset  . Diabetes Father   . Hypertension Father   . Hypertension Mother     Results for orders placed during the hospital encounter of 08/20/13 (from the past 72 hour(s))  TSH     Status: None   Collection Time    08/20/13  7:13 PM      Result Value Range   TSH 2.264  0.350 - 4.500 uIU/mL   Comment: Performed at [facility]  GLUCOSE, CAPILLARY     Status: None   Collection Time    08/21/13  6:04 AM      Result Value Range   Glucose-Capillary 99  70 - 99 mg/dL   Psychological Evaluations:  Assessment:   DSM5:  Schizophrenia Disorders:   Obsessive-Compulsive Disorders:   Trauma-Stressor Disorders:   Substance/Addictive Disorders:  Alcohol Related Disorder - Severe (303.90) Depressive Disorders:    AXIS I:  Anxiety Disorder NOS, Depressive Disorder NOS, Substance Induced Mood Disorder and alcohol dependence AXIS II:  Deferred AXIS III:   Past Medical History  Diagnosis Date  . Asthma   . Alcoholic   . Rectal bleeding   .  Hemorrhoids   . Hypertension   . Panic attacks   . Diabetes mellitus without complication    AXIS IV:  other psychosocial or environmental problems AXIS V:  41-50 serious symptoms  Treatment Plan/Recommendations:  Supportive approach/coping skills/relapse prevention                                                                 Detox/reassess and address the co morbidities  Treatment Plan Summary: Daily contact with patient to assess and evaluate symptoms and progress in treatment Medication management Current Medications:  Current Facility-Administered Medications  Medication Dose Route Frequency Provider Last Rate Last Dose  . acetaminophen (TYLENOL) tablet  650 mg  650 mg Oral Q6H PRN Earney Navy, NP      . alum & mag hydroxide-simeth (MAALOX/MYLANTA) 200-200-20 MG/5ML suspension 30 mL  30 mL Oral Q4H PRN Earney Navy, NP      . chlordiazePOXIDE (LIBRIUM) capsule 25 mg  25 mg Oral Q6H PRN Rachael Fee, MD   25 mg at 08/21/13 0603  . chlordiazePOXIDE (LIBRIUM) capsule 25 mg  25 mg Oral QID Rachael Fee, MD   25 mg at 08/21/13 0847   Followed by  . [START ON 08/22/2013] chlordiazePOXIDE (LIBRIUM) capsule 25 mg  25 mg Oral TID Rachael Fee, MD       Followed by  . [START ON 08/23/2013] chlordiazePOXIDE (LIBRIUM) capsule 25 mg  25 mg Oral BH-qamhs Rachael Fee, MD       Followed by  . [START ON 08/24/2013] chlordiazePOXIDE (LIBRIUM) capsule 25 mg  25 mg Oral Daily Rachael Fee, MD      . glimepiride (AMARYL) tablet 4 mg  4 mg Oral BID AC Verne Spurr, PA-C   4 mg at 08/21/13 4782  . hydrochlorothiazide (MICROZIDE) capsule 12.5 mg  12.5 mg Oral Daily Verne Spurr, PA-C   12.5 mg at 08/21/13 0848  . hydrocortisone cream 1 %   Topical TID Rachael Fee, MD      . hydrOXYzine (ATARAX/VISTARIL) tablet 25 mg  25 mg Oral Q6H PRN Rachael Fee, MD      . hydrOXYzine (ATARAX/VISTARIL) tablet 50 mg  50 mg Oral QHS PRN Earney Navy, NP   50 mg at 08/20/13 2020  .  Ipratropium-Albuterol (COMBIVENT) respimat 2 puff  2 puff Inhalation Q6H PRN Verne Spurr, PA-C   2 puff at 08/21/13 0850  . loperamide (IMODIUM) capsule 2-4 mg  2-4 mg Oral PRN Rachael Fee, MD      . losartan (COZAAR) tablet 100 mg  100 mg Oral Daily Verne Spurr, PA-C   100 mg at 08/21/13 0847  . magnesium hydroxide (MILK OF MAGNESIA) suspension 30 mL  30 mL Oral Daily PRN Earney Navy, NP      . metFORMIN (GLUCOPHAGE) tablet 1,000 mg  1,000 mg Oral BID WC Verne Spurr, PA-C   1,000 mg at 08/21/13 0843  . multivitamin with minerals tablet 1 tablet  1 tablet Oral Daily Rachael Fee, MD   1 tablet at 08/21/13 [phone]  . ondansetron (ZOFRAN-ODT) disintegrating tablet 4 mg  4 mg Oral Q6H PRN Rachael Fee, MD   4 mg at 08/21/13 0603  . thiamine (VITAMIN B-1) tablet 100 mg  100 mg Oral Daily Rachael Fee, MD   100 mg at 08/21/13 1308    Observation Level/Precautions:  15 minute checks  Laboratory:  As per the ED  Psychotherapy:    Medications:  Librium detox/reassess the co morbidites  Consultations:    Discharge Concerns:    Estimated LOS: 3-5 days  Other:     I certify that inpatient services furnished can reasonably be expected to improve the patient's condition.   Tasha Jindra A 8/24/20148:53 AM

## 2013-08-22 DIAGNOSIS — F10239 Alcohol dependence with withdrawal, unspecified: Principal | ICD-10-CM

## 2013-08-22 LAB — GLUCOSE, CAPILLARY
Glucose-Capillary: 186 mg/dL — ABNORMAL HIGH (ref 70–99)
Glucose-Capillary: 167 mg/dL — ABNORMAL HIGH (ref 70–99)

## 2013-08-22 MED ORDER — TRAZODONE HCL 50 MG PO TABS
50.0000 mg | ORAL_TABLET | Freq: Every day | ORAL | Status: DC
  Administered 2013-08-22 – 2013-08-24 (×3): 50 mg via ORAL
  Filled 2013-08-22 (×3): qty 1
  Filled 2013-08-22: qty 14
  Filled 2013-08-22: qty 1

## 2013-08-22 NOTE — [facility] Suicide Risk Assessment (Signed)
[facility] INPATIENT: Family/Significant Other Suicide Prevention Education  Suicide Prevention Education:  Education Completed; No one has been identified by the patient as the family member/significant other with whom the patient will be residing, and identified as the person(s) who will aid the patient in the event of a mental health crisis (suicidal ideations/suicide attempt).   Pt did not c/o SI at admission, nor have they endorsed SI during their stay here. SPE not required. SPI pamphlet provded to pt and he was encouraged to ask questions and talk about concerns relating to SPE.   The Sherwin-Maloney, LCSWA 08/22/2013 1:01 PM

## 2013-08-22 NOTE — Progress Notes (Signed)
[facility] MD Progress Note  08/22/2013 5:56 PM Hunter Davis  MRN:  [id number]  Subjective:  Hunter Davis reports, "I'm still having some tremors and headaches. I di not sleep last nigh. The majority of my problem is not being able to sleep at night. I need some sleep aid. Still going through the effects of alcohol withdrawal, a lot of fatigue and restlessness.  Diagnosis:   DSM5: Schizophrenia Disorders:  NA Obsessive-Compulsive Disorders:  NA Trauma-Stressor Disorders:  NA Substance/Addictive Disorders:  Alcohol Withdrawal (291.81) Depressive Disorders:  NA  Axis I: Alcohol withdrawal Axis II: Deferred Axis III:  Past Medical History  Diagnosis Date  . Asthma   . Alcoholic   . Rectal bleeding   . Hemorrhoids   . Hypertension   . Panic attacks   . Diabetes mellitus without complication    Axis IV: Alcoholism Axis V: 41-50 serious symptoms  ADL's:  Intact  Sleep: Poor  Appetite:  Good  Suicidal Ideation:  Plan:  Denies Intent:  Denies Means:  Denies Homicidal Ideation:  Plan:  Denies Intent:  Denies Means:  Denies AEB (as evidenced by):  Psychiatric Specialty Exam: Review of Systems  Constitutional: Positive for malaise/fatigue.  Eyes: Negative.   Respiratory: Negative.   Cardiovascular: Negative.   Gastrointestinal: Negative.   Genitourinary: Negative.   Musculoskeletal: Positive for myalgias.  Skin: Negative.   Neurological: Positive for tremors, weakness and headaches.  Psychiatric/Behavioral: Positive for substance abuse (Alcoholism). Negative for depression, suicidal ideas, hallucinations and memory loss. The patient is nervous/anxious (Currently being stabilized with medication) and has insomnia.     Blood pressure 133/75, pulse 121, temperature 98.3 F (36.8 C), temperature source Oral, resp. rate 20, height 5\' 7"  (1.702 m), weight 83.008 kg (183 lb).Body mass index is 28.66 kg/(m^2).  General Appearance: Casual  Eye Contact::  Good  Speech:  Clear and  Coherent and Normal Rate  Volume:  Normal  Mood:  Anxious and restless  Affect:  Flat  Thought Process:  Coherent, Goal Directed and Intact  Orientation:  Full (Time, Place, and Person)  Thought Content:  Rumination  Suicidal Thoughts:  No  Homicidal Thoughts:  No  Memory:  Immediate;   Good Recent;   Good Remote;   Good  Judgement:  Fair  Insight:  Fair  Psychomotor Activity:  Restlessness and Tremor  Concentration:  Fair  Recall:  Good  Akathisia:  No  Handed:  Right  AIMS (if indicated):     Assets:  Communication Skills Desire for Improvement  Sleep:  Number of Hours: 4.75   Current Medications: Current Facility-Administered Medications  Medication Dose Route Frequency Provider Last Rate Last Dose  . acetaminophen (TYLENOL) tablet 650 mg  650 mg Oral Q6H PRN Earney Navy, NP   650 mg at 08/21/13 1204  . alum & mag hydroxide-simeth (MAALOX/MYLANTA) 200-200-20 MG/5ML suspension 30 mL  30 mL Oral Q4H PRN Earney Navy, NP      . chlordiazePOXIDE (LIBRIUM) capsule 25 mg  25 mg Oral Q6H PRN Rachael Fee, MD   25 mg at 08/21/13 2149  . [START ON 08/23/2013] chlordiazePOXIDE (LIBRIUM) capsule 25 mg  25 mg Oral BH-qamhs Rachael Fee, MD       Followed by  . [START ON 08/24/2013] chlordiazePOXIDE (LIBRIUM) capsule 25 mg  25 mg Oral Daily Rachael Fee, MD      . glimepiride (AMARYL) tablet 4 mg  4 mg Oral BID AC Verne Spurr, PA-C   4 mg at 08/22/13  1726  . hydrochlorothiazide (MICROZIDE) capsule 12.5 mg  12.5 mg Oral Daily Verne Spurr, PA-C   12.5 mg at 08/22/13 0981  . hydrocortisone cream 1 %   Topical TID Rachael Fee, MD      . hydrOXYzine (ATARAX/VISTARIL) tablet 25 mg  25 mg Oral Q6H PRN Rachael Fee, MD      . hydrOXYzine (ATARAX/VISTARIL) tablet 50 mg  50 mg Oral QHS PRN Earney Navy, NP   50 mg at 08/21/13 2149  . Ipratropium-Albuterol (COMBIVENT) respimat 2 puff  2 puff Inhalation Q6H PRN Verne Spurr, PA-C   2 puff at 08/22/13 [phone]  . loperamide  (IMODIUM) capsule 2-4 mg  2-4 mg Oral PRN Rachael Fee, MD      . losartan (COZAAR) tablet 100 mg  100 mg Oral Daily Verne Spurr, PA-C   100 mg at 08/22/13 7829  . magnesium hydroxide (MILK OF MAGNESIA) suspension 30 mL  30 mL Oral Daily PRN Earney Navy, NP      . metFORMIN (GLUCOPHAGE) tablet 1,000 mg  1,000 mg Oral BID WC Verne Spurr, PA-C   1,000 mg at 08/22/13 1726  . multivitamin with minerals tablet 1 tablet  1 tablet Oral Daily Rachael Fee, MD   1 tablet at 08/22/13 [phone]  . ondansetron (ZOFRAN-ODT) disintegrating tablet 4 mg  4 mg Oral Q6H PRN Rachael Fee, MD   4 mg at 08/21/13 0603  . thiamine (VITAMIN B-1) tablet 100 mg  100 mg Oral Daily Rachael Fee, MD   100 mg at 08/22/13 0827  . traZODone (DESYREL) tablet 50 mg  50 mg Oral QHS Sanjuana Kava, NP        Lab Results:  Results for orders placed during the hospital encounter of 08/20/13 (from the past 48 hour(s))  TSH     Status: None   Collection Time    08/20/13  7:13 PM      Result Value Range   TSH 2.264  0.350 - 4.500 uIU/mL   Comment: Performed at [facility]  GLUCOSE, CAPILLARY     Status: None   Collection Time    08/21/13  6:04 AM      Result Value Range   Glucose-Capillary 99  70 - 99 mg/dL  GLUCOSE, CAPILLARY     Status: None   Collection Time    08/22/13  6:37 AM      Result Value Range   Glucose-Capillary 75  70 - 99 mg/dL   Comment 1 Notify RN     Comment 2 Documented in Chart    GLUCOSE, CAPILLARY     Status: Abnormal   Collection Time    08/22/13  5:05 PM      Result Value Range   Glucose-Capillary 133 (*) 70 - 99 mg/dL    Physical Findings: AIMS: Facial and Oral Movements Muscles of Facial Expression: None, normal Lips and Perioral Area: None, normal Jaw: None, normal Tongue: None, normal,Extremity Movements Upper (arms, wrists, hands, fingers): None, normal Lower (legs, knees, ankles, toes): None, normal, Trunk Movements Neck, shoulders, hips: None, normal, Overall  Severity Severity of abnormal movements (highest score from questions above): None, normal Incapacitation due to abnormal movements: None, normal Patient's awareness of abnormal movements (rate only patient's report): No Awareness, Dental Status Current problems with teeth and/or dentures?: No Does patient usually wear dentures?: No  CIWA:  CIWA-Ar Total: 1 COWS:  COWS Total Score: 2  Treatment Plan Summary: Daily contact  with patient to assess and evaluate symptoms and progress in treatment Medication management  Plan: Supportive approach/coping skills/relapse prevention. Initiate Trazodone 50 mg Q bedtime for sleep Encouraged out of room, participation in group sessions and application of coping skills when distressed. Will continue to monitor response to/adverse effects of medications in use to assure effectiveness. Continue to monitor mood, behavior and interaction with staff and other patients. Continue current plan of care.  Medical Decision Making Problem Points:  Review of last therapy session (1) and Review of psycho-social stressors (1) Data Points:  Review of medication regiment & side effects (2)  I certify that inpatient services furnished can reasonably be expected to improve the patient's condition.   Armandina Stammer I, PMHNP-BC 08/22/2013, 5:56 PM

## 2013-08-22 NOTE — [facility] Group Notes (Signed)
[facility] LCSW Aftercare Discharge Planning Group Note   08/22/2013 9:31 AM  Participation Quality:  Appropriate   Mood/Affect:  Appropriate  Depression Rating:  7  Anxiety Rating:  7  Thoughts of Suicide:  No Will you contract for safety?   NA  Current AVH:  No  Plan for Discharge/Comments:  Pt reports that he and his wife decided that he should come for detox to [facility]. Pt stated that he had been drinking heavily for "sometime now" and had been experiencing black outs. He reports that he would like to followup o/p for med management and therapy ([facility]?) He reports withdrawal symptoms today including tremors, chills, and headaches.   Transportation Means: wife  Supports: Metallurgist, Research scientist (physical sciences)

## 2013-08-22 NOTE — [facility] Group Notes (Signed)
[facility] LCSW Group Therapy  08/22/2013 4:54 PM  Type of Therapy:  Group Therapy  Participation Level:  Did Not Attend   SmartHerbert Seta 08/22/2013, 4:54 PM

## 2013-08-22 NOTE — Tx Team (Signed)
Interdisciplinary Treatment Plan Update (Adult)  Date: 08/22/2013   Time Reviewed: 11:35 AM  Progress in Treatment:  Attending groups: Yes Participating in groups:  Yes Taking medication as prescribed: Yes  Tolerating medication: Yes  Family/Significant othe contact made: No  Patient understands diagnosis: Yes, AEB seeking treatment for ETOH detox.  Discussing patient identified problems/goals with staff: Yes  Medical problems stabilized or resolved: Yes  Denies suicidal/homicidal ideation: Yes during admission and self report.  Patient has not harmed self or Others: Yes  New problem(s) identified: Pt reports that he is very uncomfortable in the group setting and does not feel that he is making progress in this setting.  Discharge Plan or Barriers: pt would like to f/u o/p with therapist. He reports that he does not take mental health meds and is not in need of psychiatrist. Pt hoping for therapist [place]  Additional comments: After he went to [facility] he was sober for a yeara and two months. Relapsed Memorail Day 2013. sttes he was unemployed three and a half years, tried to be self employed, thingss spiraled dwon. 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 Has done projects for [employer], children gradens, not conssitedt. Fiananciially debt free dis a loto f savings. States he has worked all his life, not used ot it.Hunter Davis unemplyment tried to find a job for two years gave up on it Reason for Continuation of Hospitalization: Librium taper Medication management  Estimated length of stay: 2-4 days  For review of initial/current patient goals, please see plan of care.  Attendees:  Patient:    Family:    Physician:    Nursing: Patsy Lager RN 08/22/2013 11:36 AM   Clinical Social Worker The Sherwin-Mizuno, LCSWA  08/22/2013 11:36 AM   Other: Lupita Leash RN 08/22/2013 11:36 AM   Other: Aggie PA 08/22/2013 11:36 AM    Other: Darden Dates Nurse Cm 08/22/2013 11:36 AM   Other:    Scribe for Treatment Team:  The Sherwin-Marcil LCSWA 08/22/2013 11:36 AM
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
## 2013-08-22 NOTE — Progress Notes (Signed)
Patient ID: DERIAN DIMALANTA, male   DOB: 03/12/1966, 47 y.o.   MRN: [id number]  D: Pt denies SI/HI/AVH. Pt is pleasant and cooperative. Pt states "I'm tired, the medications make me tired"  A: Pt was offered support and encouragement. Pt was given scheduled medications. Pt was encourage to attend groups. Q 15 minute checks were done for safety.   R:Pt attends groups and interacts well with peers and staff. Pt is taking medication. Pt has no complaints at this time.Pt receptive to treatment and safety maintained on unit.

## 2013-08-22 NOTE — Progress Notes (Signed)
The focus of this group is to help patients review their daily goal of treatment and discuss progress on daily workbooks.  Ario did not attend the A/A meeting tonight.

## 2013-08-23 LAB — GLUCOSE, CAPILLARY: Glucose-Capillary: 107 mg/dL — ABNORMAL HIGH (ref 70–99)

## 2013-08-23 NOTE — Progress Notes (Signed)
Patient ID: KIEV LABROSSE, male   DOB: 1966-03-04, 47 y.o.   MRN: [id number] [facility] MD Progress Note  08/22/2013 5:56 PM Hunter Davis  MRN:  [id number]  Subjective: Patient is up and active in the unit milieu. He is attending groups and taking his medication appropriately. He reports lessening symptoms of withdrawal and his tremors are only fine tremors at this point. He states he is sleeping well and his appetite is improving. He is stating that his wife is setting him up with a psychiatrist upon discharge. Diagnosis:   DSM5: Schizophrenia Disorders:  NA Obsessive-Compulsive Disorders:  NA Trauma-Stressor Disorders:  NA Substance/Addictive Disorders:  Alcohol Withdrawal (291.81) Depressive Disorders:  NA  Axis I: Alcohol withdrawal Axis II: Deferred Axis III:  Past Medical History  Diagnosis Date  . Asthma   . Alcoholic   . Rectal bleeding   . Hemorrhoids   . Hypertension   . Panic attacks   . Diabetes mellitus without complication    Axis IV: Alcoholism Axis V: 41-50 serious symptoms  ADL's:  Intact  Sleep: Poor  Appetite:  Good  Suicidal Ideation:  Plan:  Denies Intent:  Denies Means:  Denies Homicidal Ideation:  Plan:  Denies Intent:  Denies Means:  Denies AEB (as evidenced by):  Psychiatric Specialty Exam: Review of Systems  Constitutional: Positive for malaise/fatigue.  Eyes: Negative.   Respiratory: Negative.   Cardiovascular: Negative.   Gastrointestinal: Negative.   Genitourinary: Negative.   Musculoskeletal: Positive for myalgias.  Skin: Negative.   Neurological: Positive for tremors, weakness and headaches.  Psychiatric/Behavioral: Positive for substance abuse (Alcoholism). Negative for depression, suicidal ideas, hallucinations and memory loss. The patient is nervous/anxious (Currently being stabilized with medication) and has insomnia.     Blood pressure 133/75, pulse 121, temperature 98.3 F (36.8 C), temperature source Oral, resp. rate 20,  height 5\' 7"  (1.702 m), weight 83.008 kg (183 lb).Body mass index is 28.66 kg/(m^2).  General Appearance: Casual  Eye Contact::  Good  Speech:  Clear and Coherent and Normal Rate  Volume:  Normal  Mood:  Anxious and restless  Affect:  Flat  Thought Process:  Coherent, Goal Directed and Intact  Orientation:  Full (Time, Place, and Person)  Thought Content:  Rumination  Suicidal Thoughts:  No  Homicidal Thoughts:  No  Memory:  Immediate;   Good Recent;   Good Remote;   Good  Judgement:  Fair  Insight:  Fair  Psychomotor Activity:  Restlessness and Tremor  Concentration:  Fair  Recall:  Good  Akathisia:  No  Handed:  Right  AIMS (if indicated):     Assets:  Communication Skills Desire for Improvement  Sleep:  Number of Hours: 4.75   Current Medications: Current Facility-Administered Medications  Medication Dose Route Frequency Provider Last Rate Last Dose  . acetaminophen (TYLENOL) tablet 650 mg  650 mg Oral Q6H PRN Earney Navy, NP   650 mg at 08/21/13 1204  . alum & mag hydroxide-simeth (MAALOX/MYLANTA) 200-200-20 MG/5ML suspension 30 mL  30 mL Oral Q4H PRN Earney Navy, NP      . chlordiazePOXIDE (LIBRIUM) capsule 25 mg  25 mg Oral Q6H PRN Rachael Fee, MD   25 mg at 08/21/13 2149  . [START ON 08/23/2013] chlordiazePOXIDE (LIBRIUM) capsule 25 mg  25 mg Oral BH-qamhs Rachael Fee, MD       Followed by  . [START ON 08/24/2013] chlordiazePOXIDE (LIBRIUM) capsule 25 mg  25 mg Oral Daily Rachael Fee,  MD      . glimepiride (AMARYL) tablet 4 mg  4 mg Oral BID AC Verne Spurr, PA-C   4 mg at 08/22/13 1726  . hydrochlorothiazide (MICROZIDE) capsule 12.5 mg  12.5 mg Oral Daily Verne Spurr, PA-C   12.5 mg at 08/22/13 1610  . hydrocortisone cream 1 %   Topical TID Rachael Fee, MD      . hydrOXYzine (ATARAX/VISTARIL) tablet 25 mg  25 mg Oral Q6H PRN Rachael Fee, MD      . hydrOXYzine (ATARAX/VISTARIL) tablet 50 mg  50 mg Oral QHS PRN Earney Navy, NP   50 mg at  08/21/13 2149  . Ipratropium-Albuterol (COMBIVENT) respimat 2 puff  2 puff Inhalation Q6H PRN Verne Spurr, PA-C   2 puff at 08/22/13 [phone]  . loperamide (IMODIUM) capsule 2-4 mg  2-4 mg Oral PRN Rachael Fee, MD      . losartan (COZAAR) tablet 100 mg  100 mg Oral Daily Verne Spurr, PA-C   100 mg at 08/22/13 5409  . magnesium hydroxide (MILK OF MAGNESIA) suspension 30 mL  30 mL Oral Daily PRN Earney Navy, NP      . metFORMIN (GLUCOPHAGE) tablet 1,000 mg  1,000 mg Oral BID WC Verne Spurr, PA-C   1,000 mg at 08/22/13 1726  . multivitamin with minerals tablet 1 tablet  1 tablet Oral Daily Rachael Fee, MD   1 tablet at 08/22/13 [phone]  . ondansetron (ZOFRAN-ODT) disintegrating tablet 4 mg  4 mg Oral Q6H PRN Rachael Fee, MD   4 mg at 08/21/13 0603  . thiamine (VITAMIN B-1) tablet 100 mg  100 mg Oral Daily Rachael Fee, MD   100 mg at 08/22/13 0827  . traZODone (DESYREL) tablet 50 mg  50 mg Oral QHS Sanjuana Kava, NP        Lab Results:  Results for orders placed during the hospital encounter of 08/20/13 (from the past 48 hour(s))  TSH     Status: None   Collection Time    08/20/13  7:13 PM      Result Value Range   TSH 2.264  0.350 - 4.500 uIU/mL   Comment: Performed at [facility]  GLUCOSE, CAPILLARY     Status: None   Collection Time    08/21/13  6:04 AM      Result Value Range   Glucose-Capillary 99  70 - 99 mg/dL  GLUCOSE, CAPILLARY     Status: None   Collection Time    08/22/13  6:37 AM      Result Value Range   Glucose-Capillary 75  70 - 99 mg/dL   Comment 1 Notify RN     Comment 2 Documented in Chart    GLUCOSE, CAPILLARY     Status: Abnormal   Collection Time    08/22/13  5:05 PM      Result Value Range   Glucose-Capillary 133 (*) 70 - 99 mg/dL    Physical Findings: AIMS: Facial and Oral Movements Muscles of Facial Expression: None, normal Lips and Perioral Area: None, normal Jaw: None, normal Tongue: None, normal,Extremity Movements Upper  (arms, wrists, hands, fingers): None, normal Lower (legs, knees, ankles, toes): None, normal, Trunk Movements Neck, shoulders, hips: None, normal, Overall Severity Severity of abnormal movements (highest score from questions above): None, normal Incapacitation due to abnormal movements: None, normal Patient's awareness of abnormal movements (rate only patient's report): No Awareness, Dental Status Current problems with teeth  and/or dentures?: No Does patient usually wear dentures?: No  CIWA:  CIWA-Ar Total: 1 COWS:  COWS Total Score: 2  Treatment Plan Summary: Daily contact with patient to assess and evaluate symptoms and progress in treatment Medication management  Plan: Supportive approach/coping skills/relapse prevention. ContinueTrazodone 50 mg Q bedtime for sleep Encouraged out of room, participation in group sessions and application of coping skills when distressed. Will continue to monitor response to/adverse effects of medications in use to assure effectiveness. Continue to monitor mood, behavior and interaction with staff and other patients. Continue current plan of care.  Medical Decision Making Problem Points:  Review of last therapy session (1) and Review of psycho-social stressors (1) Data Points:  Review of medication regiment & side effects (2)  I certify that inpatient services furnished can reasonably be expected to improve the patient's condition.   Rona Ravens. Jerran Tappan [facility] 08/23/2013

## 2013-08-23 NOTE — Progress Notes (Signed)
Recreation Therapy Notes  Date: 08.26.2014 Time: 2:30pm Location: [address] Dayroom  Group Topic: Animal Assisted Activities  Goal Area(s) Addresses:  Patient will interact appropriately with dog team.    Behavioral Response: Appropriate, Attentive, Engaged  Education: Coping Skill   Education Outcome: Acknowledges understanding  Clinical Observations/Feedback: Dog Team: [employer]. Patient interacted appropriately with peer, dog team and LRT.    Marykay Lex Anik Wesch, LRT/CTRS  Jearl Klinefelter 08/23/2013 4:43 PM

## 2013-08-23 NOTE — Progress Notes (Signed)
Recreation Therapy Notes  Date: 08.26.2014 Time: 3:00pm Location: [address] Dayroom  Group Topic: Communication, Team Building, Problem Solving  Goal Area(s) Addresses:  Patient will be able to recognize use of communication, team building and problem solving during course of group activity.  Patient will verbalize qualities used to make decisions during group session.  Patient will verbalize ability to use skills to build healthy support system post d/c.   Behavioral Response: Appropriate, Attentive, Engaged  Intervention: Problem Solving Activity.  Activity: Life Boat. Patients were given a scenario about being caught on a sinking ship. In this scenario patients were informed all members of group session, in addition to 8 individuals off of a list of 15 could fit on the life boat. List of individuals included people from all socioeconomic status, for example: President Obama, Midwife, Runner, broadcasting/film/video, Education officer, museum.    Education: LIfe Skills, Discharge Planning   Education Outcome: Acknowledges understanding  Clinical Observations/Feedback: Patient contributed to opening discussion contributing to group definition of recovery. Patient actively participated in group activity, giving justification for individuals he wanted to put on life boat, as well as debating with peer appropriately. Patient contributed to wrap up discussion, identifying team work and communication as necessary skills to make activity successful.   Marykay Lex Nadie Fiumara, LRT/CTRS  Jearl Klinefelter 08/23/2013 4:32 PM

## 2013-08-23 NOTE — Progress Notes (Signed)
Patient ID: Hunter Davis, male   DOB: 06/28/1966, 47 y.o.   MRN: [id number]  D: Patient pleasant on approach today. Reports depression and hopelessness "3" on scales. Reports minimal withdrawals at present (chilling). Currently has no suicidal ideations. Reports feeling better and more awake today. Said he felt "out of it" yesterday.  A: Staff will monitor on q 15 minute checks, follow treatment plan, and give meds as ordered. R: Taking medications and attending groups.

## 2013-08-23 NOTE — Progress Notes (Signed)
The focus of this group is to educate the patient on the purpose and policies of crisis stabilization and provide a format to answer questions about their admission.  The group details unit policies and expectations of patients while admitted. Patient was engaging and had plenty of questions to ask.

## 2013-08-24 LAB — GLUCOSE, CAPILLARY: Glucose-Capillary: 107 mg/dL — ABNORMAL HIGH (ref 70–99)

## 2013-08-24 NOTE — BHH Group Notes (Signed)
[facility] LCSW Aftercare Discharge Planning Group Note   08/24/2013 9:29 AM  Participation Quality:  Appropriate   Mood/Affect:  Appropriate  Depression Rating:  0  Anxiety Rating:  0  Thoughts of Suicide:  No Will you contract for safety?   NA  Current AVH:  No  Plan for Discharge/Comments:  [facility]. He stated that he is not in need of psychiatrist because he does not take medications and does not plan to take anything prescribed after d/c. Pt stated that he wants to  D/c asap and feels good about going home and returning back to "normal life."   Transportation Means: wife  Supports: wife  Counselling psychologist, [place]

## 2013-08-24 NOTE — Progress Notes (Signed)
Agree with assessment and plan [place] A. Jerrold Haskell, M.D. 

## 2013-08-24 NOTE — Progress Notes (Signed)
[facility] MD Progress Note  08/24/2013 4:22 PM Hunter Davis  MRN:  [id number] Subjective:  Hunter Davis has been de toxing uneventfully. He is aware of the effect of alcohol on his liver. He states that he is feeling better as the detox has progressed. He does not think he needs to go to rehab. His wife is looking at outpatient services. He states he is committed to abstinence. There are life style changes that he has to put in place to be able to accomplish long term abstinence and he is willing to work on them Diagnosis:   DSM5: Schizophrenia Disorders:   Obsessive-Compulsive Disorders:   Trauma-Stressor Disorders:   Substance/Addictive Disorders:  Alcohol Related Disorder - Severe (303.90) Depressive Disorders:    Axis I: Substance Induced Mood Disorder  ADL's:  Intact  Sleep: Fair  Appetite:  Fair  Suicidal Ideation:  Plan:  denies Intent:  denies Means:  denies Homicidal Ideation:  Plan:  denies Intent:  denies Means:  denies AEB (as evidenced by):  Psychiatric Specialty Exam: Review of Systems  Constitutional: Negative.   HENT: Negative.   Eyes: Negative.   Respiratory: Negative.   Cardiovascular: Negative.   Gastrointestinal: Negative.   Genitourinary: Negative.   Musculoskeletal: Negative.   Skin: Negative.   Neurological: Negative.   Endo/Heme/Allergies: Negative.   Psychiatric/Behavioral: Positive for substance abuse. The patient is nervous/anxious.     Blood pressure 110/68, pulse 85, temperature 98.1 F (36.7 C), temperature source Oral, resp. rate 16, height 5\' 7"  (1.702 m), weight 83.008 kg (183 lb).Body mass index is 28.66 kg/(m^2).  General Appearance: Fairly Groomed  Patent attorney::  Fair  Speech:  Clear and Coherent  Volume:  Decreased  Mood:  Anxious and Depressed  Affect:  Restricted  Thought Process:  Coherent and Goal Directed  Orientation:  Full (Time, Place, and Person)  Thought Content:  worries, concerns  Suicidal Thoughts:  No  Homicidal  Thoughts:  No  Memory:  Immediate;   Fair Recent;   Fair Remote;   Fair  Judgement:  Fair  Insight:  Present and superficial  Psychomotor Activity:  Decreased  Concentration:  Fair  Recall:  Fair  Akathisia:  No  Handed:  Right  AIMS (if indicated):     Assets:  Desire for Improvement Housing Social Support  Sleep:  Number of Hours: 6.25   Current Medications: Current Facility-Administered Medications  Medication Dose Route Frequency Provider Last Rate Last Dose  . acetaminophen (TYLENOL) tablet 650 mg  650 mg Oral Q6H PRN Earney Navy, NP   650 mg at 08/24/13 0809  . alum & mag hydroxide-simeth (MAALOX/MYLANTA) 200-200-20 MG/5ML suspension 30 mL  30 mL Oral Q4H PRN Earney Navy, NP      . glimepiride (AMARYL) tablet 4 mg  4 mg Oral BID AC Verne Spurr, PA-C   4 mg at 08/24/13 0646  . hydrochlorothiazide (MICROZIDE) capsule 12.5 mg  12.5 mg Oral Daily Verne Spurr, PA-C   12.5 mg at 08/24/13 4098  . hydrocortisone cream 1 %   Topical TID Rachael Fee, MD      . hydrOXYzine (ATARAX/VISTARIL) tablet 50 mg  50 mg Oral QHS PRN Earney Navy, NP   50 mg at 08/22/13 2300  . Ipratropium-Albuterol (COMBIVENT) respimat 2 puff  2 puff Inhalation Q6H PRN Verne Spurr, PA-C   2 puff at 08/23/13 [phone]  . losartan (COZAAR) tablet 100 mg  100 mg Oral Daily Verne Spurr, PA-C   100 mg at 08/24/13  4540  . magnesium hydroxide (MILK OF MAGNESIA) suspension 30 mL  30 mL Oral Daily PRN Earney Navy, NP      . metFORMIN (GLUCOPHAGE) tablet 1,000 mg  1,000 mg Oral BID WC Verne Spurr, PA-C   1,000 mg at 08/24/13 0807  . multivitamin with minerals tablet 1 tablet  1 tablet Oral Daily Rachael Fee, MD   1 tablet at 08/24/13 [phone]  . thiamine (VITAMIN B-1) tablet 100 mg  100 mg Oral Daily Rachael Fee, MD   100 mg at 08/24/13 9147  . traZODone (DESYREL) tablet 50 mg  50 mg Oral QHS Sanjuana Kava, NP   50 mg at 08/23/13 2139    Lab Results:  Results for orders placed during the  hospital encounter of 08/20/13 (from the past 48 hour(s))  GLUCOSE, CAPILLARY     Status: Abnormal   Collection Time    08/22/13  5:05 PM      Result Value Range   Glucose-Capillary 133 (*) 70 - 99 mg/dL  GLUCOSE, CAPILLARY     Status: Abnormal   Collection Time    08/22/13  9:25 PM      Result Value Range   Glucose-Capillary 167 (*) 70 - 99 mg/dL  GLUCOSE, CAPILLARY     Status: Abnormal   Collection Time    08/23/13  6:01 AM      Result Value Range   Glucose-Capillary 107 (*) 70 - 99 mg/dL  GLUCOSE, CAPILLARY     Status: None   Collection Time    08/23/13  5:24 PM      Result Value Range   Glucose-Capillary 96  70 - 99 mg/dL  GLUCOSE, CAPILLARY     Status: Abnormal   Collection Time    08/23/13  9:36 PM      Result Value Range   Glucose-Capillary 175 (*) 70 - 99 mg/dL  GLUCOSE, CAPILLARY     Status: None   Collection Time    08/24/13  6:15 AM      Result Value Range   Glucose-Capillary 76  70 - 99 mg/dL    Physical Findings: AIMS: Facial and Oral Movements Muscles of Facial Expression: None, normal Lips and Perioral Area: None, normal Jaw: None, normal Tongue: None, normal,Extremity Movements Upper (arms, wrists, hands, fingers): None, normal Lower (legs, knees, ankles, toes): None, normal, Trunk Movements Neck, shoulders, hips: None, normal, Overall Severity Severity of abnormal movements (highest score from questions above): None, normal Incapacitation due to abnormal movements: None, normal Patient's awareness of abnormal movements (rate only patient's report): No Awareness, Dental Status Current problems with teeth and/or dentures?: No Does patient usually wear dentures?: No  CIWA:  CIWA-Ar Total: 3 COWS:  COWS Total Score: 2  Treatment Plan Summary: Daily contact with patient to assess and evaluate symptoms and progress in treatment Medication management  Plan: Supportive approach/coping skills/relapse prevention           Reassess and address the co  morbidities  Medical Decision Making Problem Points:  Review of last therapy session (1) and Review of psycho-social stressors (1) Data Points:  Review of medication regiment & side effects (2) Review of new medications or change in dosage (2)  I certify that inpatient services furnished can reasonably be expected to improve the patient's condition.   Mattelyn Imhoff A 08/24/2013, 4:22 PM

## 2013-08-24 NOTE — Progress Notes (Signed)
Attended NA meeting

## 2013-08-24 NOTE — Progress Notes (Signed)
Adult Psychoeducational Group Note  Date:  08/24/2013 Time:  2:29 PM  Group Topic/Focus:  Crisis Planning:   The purpose of this group is to help patients create a crisis plan for use upon discharge or in the future, as needed.  Participation Level:  Active  Participation Quality:  Appropriate  Affect:  Appropriate  Cognitive:  Appropriate  Insight: Appropriate  Engagement in Group:  Engaged  Modes of Intervention:  Support  Additional Comments:  Patient created Crisis Plan  Lauralee Evener 08/24/2013, 2:29 PM

## 2013-08-24 NOTE — Progress Notes (Signed)
D: Patient denies SI/HI/AVH. Patient affect is blunted. Mood is depressed.  Patient did attend evening group. Patient visible on the milieu. No distress noted. A: Support and encouragement offered. Scheduled medications given to pt. Q 15 min checks continued for patient safety. R: Patient receptive. Patient remains safe on the unit.
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
## 2013-08-24 NOTE — Progress Notes (Signed)
Patient ID: Hunter Davis, male   DOB: [birth year], 47 y.o.   MRN: [id number]  D: Patient pleasant on approach this am. Denies depressed mood or hopeless feelings. Currently denies any SI. Patient having headaches at times and getting tylenol prn.  A: Staff will continue to monitor on q 15 minute checks, follow treatment plan, and give meds as ordered. R: No distress today. Taking meds and attending groups.

## 2013-08-24 NOTE — [facility] Group Notes (Signed)
[facility] LCSW Group Therapy  08/24/2013 3:13 PM  Type of Therapy:  Group Therapy  Participation Level:  Active  Participation Quality:  Attentive  Affect:  Appropriate  Cognitive:  Alert and Oriented  Insight:  Engaged  Engagement in Therapy:  Engaged  Modes of Intervention:  Discussion, Education, Exploration, Socialization and Support  Summary of Progress/Problems: Emotion Regulation: This group focused on both positive and negative emotion identification and allowed group members to process ways to identify feelings, regulate negative emotions, and find healthy ways to manage internal/external emotions. Group members were asked to reflect on a time when their reaction to an emotion led to a negative outcome and explored how alternative responses using emotion regulation would have benefited them. Group members were also asked to discuss a time when emotion regulation was utilized when a negative emotion was experienced. Laurel stated that he felt resentful and ashamed upon entering [facility]. Cohen was able to explain to there group what these emotions feel like to him mentally and physically. He was attentive and engaged throughout group and appears to be making progress in the group setting AEB his high level of participation and ability to interact with other group members. Antwaine shows improving insight AEB his ability to identify negative responses to these emotions: drinking/isolating, and identify healthier ways to address negative emotions "talk to my wife and therapist, take better care of myself physically, get back to doing things that I enjoy when I feel stressed out (motorcross/go carts).    Smart, Philomene Haff 08/24/2013, 3:13 PM

## 2013-08-25 DIAGNOSIS — F10239 Alcohol dependence with withdrawal, unspecified: Secondary | ICD-10-CM

## 2013-08-25 LAB — GLUCOSE, CAPILLARY

## 2013-08-25 MED ORDER — TRAZODONE HCL 50 MG PO TABS: 50.0000 mg | ORAL_TABLET | Freq: Every day | ORAL | Status: DC

## 2013-08-25 MED ORDER — IPRATROPIUM-ALBUTEROL 18-103 MCG/ACT IN AERO: 2.0000 | INHALATION_SPRAY | Freq: Four times a day (QID) | RESPIRATORY_TRACT | Status: DC | PRN

## 2013-08-25 MED ORDER — HYDROXYZINE HCL 50 MG PO TABS: 50.0000 mg | ORAL_TABLET | Freq: Every evening | ORAL | Status: AC | PRN

## 2013-08-25 MED ORDER — METFORMIN HCL 1000 MG PO TABS: 1000.0000 mg | ORAL_TABLET | Freq: Two times a day (BID) | ORAL | Status: AC

## 2013-08-25 MED ORDER — GLIMEPIRIDE 4 MG PO TABS: 4.0000 mg | ORAL_TABLET | Freq: Two times a day (BID) | ORAL | Status: AC

## 2013-08-25 MED ORDER — LOSARTAN POTASSIUM-HCTZ 100-12.5 MG PO TABS: 1.0000 | ORAL_TABLET | Freq: Every morning | ORAL | Status: AC

## 2013-08-25 NOTE — Progress Notes (Signed)
Adult Psychoeducational Group Note  Date:  08/25/2013 Time:  10:26 AM  Group Topic/Focus:  alcoholics Anonymous  Participation Level:  Did Not Attend  Participation Quality:  Did not attend  Affect:  Did not attend  Cognitive:  Did not attend  Insight: Did not attend  Engagement in Group:  Did not attend  Modes of Intervention:  Did not attend  Additional Comments: Did not attend  [place] Shi K 08/25/2013, 10:26 AM

## 2013-08-25 NOTE — Progress Notes (Signed)
[facility] Adult Case Management Discharge Plan :  Will you be returning to the same living situation after discharge: Yes,  home with wife At discharge, do you have transportation home?:Yes,  wife Do you have the ability to pay for your medications:Yes,  Private insurance  Release of information consent forms completed and in the chart;  Patient's signature needed at discharge.  Patient to Follow up at: Follow-up Information   Follow up with [facility] On 08/31/2013. (Appt with Matt Sixberry at 10:30AM for therapy. )    Contact information:   [address] [place], [place] [address] Phone: [phone] Fax: [phone]      Patient denies SI/HI:   Yes,  during admission, group, and self report    Safety Planning and Suicide Prevention discussed:  Yes,  SPE not required for this pt as he did not endorse SI during admission or stay at [facility]. Pt provided with SPI pamphlet and encouraged to share with support system.   Smart, Skyelyn Scruggs 08/25/2013, 9:25 AM

## 2013-08-25 NOTE — [facility] Group Notes (Signed)
[facility] LCSW Group Therapy  08/25/2013  1:15 PM   Type of Therapy:  Group Therapy  Participation Level:  Active  Participation Quality:  Appropriate and Attentive  Affect:  Appropriate, Flat and Depressed  Cognitive:  Alert and Appropriate  Insight:  Developing/Improving and Engaged  Engagement in Therapy:  Developing/Improving and Engaged  Modes of Intervention:  Clarification, Confrontation, Discussion, Education, Exploration, Limit-setting, Orientation, Problem-solving, Rapport Building, Dance movement psychotherapist, Socialization and Support  Summary of Progress/Problems: Finding Balance in Life. Today's group focused on defining balance in one's own words, identifying things that can knock one off balance, and exploring healthy ways to maintain balance in life. Group members were asked to provide an example of a time when they felt off balance, describe how they handled that situation,and process healthier ways to regain balance in the future. Group members were asked to share the most important tool for maintaining balance that they learned while at [facility] and how they plan to apply this method after discharge.  Pt shared that he stopped going to the doctor because of the condition he was in and was ashamed and felt guilty for it.  Pt states that he plans to restore balance by having a regular schedule, staying busy and link up with vocation rehab for assistance with employment.  Pt actively participated and was engaged in group discussion.    Reyes Ivan, LCSWA 08/25/2013 2:17 PM

## 2013-08-25 NOTE — Progress Notes (Signed)
The focus of this group is to educate the patient on the purpose and policies of crisis stabilization and provide a format to answer questions about their admission.  The group details unit policies and expectations of patients while admitted.  Patient attended and actively participated in the discussion.  He was engaged and respectful of others.  

## 2013-08-25 NOTE — Progress Notes (Signed)
Patient ID: Hunter Davis, male   DOB: [birth year], 47 y.o.   MRN: [id number] 0630 CBG 56, asymptomatic,  writer administered 3 glucose tabs CBG at 0659 65, leaving at 0700 for breakfast.

## 2013-08-25 NOTE — Progress Notes (Signed)
Patient ID: Hunter Davis, male   DOB: 08-Oct-1966, 47 y.o.   MRN: [id number] D:  Patient discharged to room.  All belongings retrieved from room and from locker number 112.  Patient denies suicidal thoughts.   A:  Reviewed all discharge instructions, medications, and follow up care.  Escorted the patient to the search room and to the lobby.  He was given a two week supply of medications from the hospital pharmacy.  R:  Patient verbalized understanding of all instructions.  He left the unit ambulatory and was escorted to the search room and then to the front lobby.  His wife was waiting to take him home.

## 2013-08-25 NOTE — Discharge Summary (Signed)
Physician Discharge Summary Note  Patient:  Hunter Davis is an 47 y.o., male MRN:  [id number] DOB:  1966-07-29 Patient phone:  [phone] (home)  Patient address:   [address] [place] [address],   Date of Admission:  08/20/2013 Date of Discharge: 08/25/13  Reason for Admission:  Alcohol witdrawal  Discharge Diagnoses: Active Problems:   Alcohol dependence   Alcohol withdrawal  Review of Systems  Constitutional: Negative.   HENT: Negative.   Eyes: Negative.   Respiratory: Negative.   Cardiovascular: Negative.   Gastrointestinal: Negative.   Genitourinary: Negative.   Musculoskeletal: Negative.   Skin: Negative.   Neurological: Negative.   Endo/Heme/Allergies: Negative.   Psychiatric/Behavioral: Positive for substance abuse (Alcoholism). Negative for depression, suicidal ideas, hallucinations and memory loss. The patient is nervous/anxious (Stabilized with medication prior to discharge) and has insomnia (Stabilized with medication prior to discharge).     DSM5:  Schizophrenia Disorders:  NA Obsessive-Compulsive Disorders:  NA Trauma-Stressor Disorders:  NA Substance/Addictive Disorders:  Alcohol Withdrawal (291.81) Depressive Disorders:  NA  Axis Diagnosis:   AXIS I:  Alcohol Withdrawal (291.81) AXIS II:  Deferred AXIS III:   Past Medical History  Diagnosis Date  . Asthma   . Alcoholic   . Rectal bleeding   . Hemorrhoids   . Hypertension   . Panic attacks   . Diabetes mellitus without complication    AXIS IV:  other psychosocial or environmental problems and Chronic alcoholism AXIS V:  63  Level of Care:  OP  Hospital Course:  47 Y/O male who comes requesting help with his alcochol absue After he went to [facility] he was sober for a year and two months. Relapsed on Memorial Day 2013. States he was unemployed three and a half years. Tried to be self employed, things spiraled down. 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 Has done projects for [employer], children gardens, not consistent. Financially debt free did a lot of savings. 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 Has been feeling increasingly more depressed, anxious, overwhelmed.  Upon admission into this hospital, and after admission assessment/evaluation coupled with toxicology results, it was determined that Hunter Davis is intoxicated and withdrawing from alcohol will need detoxification treatment protocol to stabilize his systems of alcohol intoxication and to combat the withdrawal symptoms of as well. And his discharge plans included a referral and an appointment to an outpatient psychiatric clinic for more substance abuse treatment, routine psychiatric care and counseling. Hunter Davis was then started on [facility] detoxification treatment protocol. He was also enrolled in group counseling sessions and activities to learn coping skills that should help him after discharge to cope better, manage his substance abuse problems to maintain a much longer sobriety. He also was enrolled and attended AA/NA meetings being offered and held on this unit. He has some previous and or identifiable medical conditions that required treatment and or monitoring. He received medication management for all those health issues as well. He was monitored closely for any potential problems that may arise as a result of and or during detoxification treatment. Patient tolerated his treatment regimen and detoxification treatment without any significant adverse effects and or reactions reported.  Besides the detoxification treatment, Hunter Davis was ordered and received Trazodone 50 mg Q bedtime for sleep. Patient attended treatment team meeting this am and met with  the team staff. His reason for admission, present symptoms, substance abuse issues, response to treatment and discharge plans  discussed. Patient endorsed that he is doing well and stable for discharge to pursue the next phase of his substance abuse treatment. It was then agreed upon that he will follow-up care at the [facility] with [facility] on 08/31/13 at 10:30 am. The address, date, time and contact information provided for patient in writing.  Upon discharge, patient adamantly denies suicidal, homicidal ideations, auditory, visual hallucinations, delusional thinking and or withdrawal symptoms. Patient left [facility] with all personal belongings in no apparent distress. He received 2 weeks worth supply samples of his [facility] discharge medications. Transportation per family.   Consults:  psychiatry  Significant Diagnostic Studies:  labs: CBC with diff, CMP, UDS, Toxicology tests, U/A  Discharge Vitals:   Blood pressure 96/57, pulse 98, temperature 98.2 F (36.8 C), temperature source Oral, resp. rate 18, height 5\' 7"  (1.702 m), weight 83.008 kg (183 lb). Body mass index is 28.66 kg/(m^2). Lab Results:   Results for orders placed during the hospital encounter of 08/20/13 (from the past 72 hour(s))  GLUCOSE, CAPILLARY     Status: Abnormal   Collection Time    08/22/13  5:05 PM      Result Value Range   Glucose-Capillary 133 (*) 70 - 99 mg/dL  GLUCOSE, CAPILLARY     Status: Abnormal   Collection Time    08/22/13  9:25 PM      Result Value Range   Glucose-Capillary 167 (*) 70 - 99 mg/dL  GLUCOSE, CAPILLARY     Status: Abnormal   Collection Time    08/23/13  6:01 AM      Result Value Range   Glucose-Capillary 107 (*) 70 - 99 mg/dL  GLUCOSE, CAPILLARY     Status: None   Collection Time    08/23/13  5:24 PM      Result Value Range   Glucose-Capillary 96  70 - 99 mg/dL  GLUCOSE, CAPILLARY     Status: Abnormal   Collection Time    08/23/13  9:36 PM      Result Value Range   Glucose-Capillary 175 (*) 70 - 99 mg/dL  GLUCOSE, CAPILLARY     Status: None   Collection Time    08/24/13  6:15 AM       Result Value Range   Glucose-Capillary 76  70 - 99 mg/dL  GLUCOSE, CAPILLARY     Status: Abnormal   Collection Time    08/24/13  4:31 PM      Result Value Range   Glucose-Capillary 107 (*) 70 - 99 mg/dL  GLUCOSE, CAPILLARY     Status: Abnormal   Collection Time    08/25/13  6:25 AM      Result Value Range   Glucose-Capillary 56 (*) 70 - 99 mg/dL  GLUCOSE, CAPILLARY     Status: Abnormal   Collection Time    08/25/13  6:59 AM      Result Value Range   Glucose-Capillary 65 (*) 70 - 99 mg/dL   Comment 1 Notify RN     Comment 2 Documented in Chart      Physical Findings: AIMS: Facial and Oral Movements Muscles of Facial Expression: None, normal Lips and Perioral Area: None, normal Jaw: None, normal Tongue: None, normal,Extremity Movements Upper (arms, wrists, hands, fingers): None, normal Lower (legs, knees, ankles, toes): None, normal, Trunk Movements Neck, shoulders, hips: None, normal, Overall Severity Severity of  abnormal movements (highest score from questions above): None, normal Incapacitation due to abnormal movements: None, normal Patient's awareness of abnormal movements (rate only patient's report): No Awareness, Dental Status Current problems with teeth and/or dentures?: No Does patient usually wear dentures?: No  CIWA:  CIWA-Ar Total: 1 COWS:  COWS Total Score: 2  Psychiatric Specialty Exam: See Psychiatric Specialty Exam and Suicide Risk Assessment completed by Attending Physician prior to discharge.  Discharge destination:  Home  Is patient on multiple antipsychotic therapies at discharge:  No   Has Patient had three or more failed trials of antipsychotic monotherapy by history:  No  Recommended Plan for Multiple Antipsychotic Therapies: NA      Discharge Orders   Future Orders Complete By Expires   Discharge instructions  As directed    Comments:     Take all your medications as prescribed by your mental healthcare provider. Report any adverse  effects and or reactions from your medicines to your outpatient provider promptly. You are hereby instructed and cautioned to not engage in alcohol and or illegal drug use while on prescription medicines. In the event of worsening symptoms, patient is instructed to call the crisis hotline, 911 and or go to the nearest ED for appropriate evaluation and treatment of symptoms. Follow-up with your primary care provider for your other medical issues, concerns and or health care needs.       Medication List    STOP taking these medications       ENLYTE PO     LORazepam 1 MG tablet  Commonly known as:  ATIVAN     topiramate 25 MG tablet  Commonly known as:  TOPAMAX      TAKE these medications     Indication   albuterol-ipratropium 18-103 MCG/ACT inhaler  Commonly known as:  COMBIVENT  Inhale 2 puffs into the lungs every 6 (six) hours as needed for wheezing.   Indication:  Asthma     glimepiride 4 MG tablet  Commonly known as:  AMARYL  Take 1 tablet (4 mg total) by mouth 2 (two) times daily. For diabetes management   Indication:  Type 2 Diabetes     hydrOXYzine 50 MG tablet  Commonly known as:  ATARAX/VISTARIL  Take 1 tablet (50 mg total) by mouth at bedtime as needed (sleep).   Indication:  Anxiety associated with Organic Disease, Sleep     losartan-hydrochlorothiazide 100-12.5 MG per tablet  Commonly known as:  HYZAAR  Take 1 tablet by mouth every morning. For hypertension   Indication:  High Blood Pressure     metFORMIN 1000 MG tablet  Commonly known as:  GLUCOPHAGE  Take 1 tablet (1,000 mg total) by mouth 2 (two) times daily with a meal. For diabetes management   Indication:  Type 2 Diabetes     traZODone 50 MG tablet  Commonly known as:  DESYREL  Take 1 tablet (50 mg total) by mouth at bedtime. For sleep   Indication:  Trouble Sleeping       Follow-up Information   Follow up with [facility] On 08/31/2013. (Appt with Matt Sixberry at 10:30AM for therapy.  )    Contact information:   [address] [place], [place] [address] Phone: [phone] Fax: [phone]     Follow-up recommendations:  Activity:  As tolerated Diet: As recommended by your primary care doctor. Keep all scheduled follow-up appointments as recommended. Continue to work your relapse prevention plan  Comments: Take all your medications as prescribed by your mental  healthcare provider. Report any adverse effects and or reactions from your medicines to your outpatient provider promptly. Patient is instructed and cautioned to not engage in alcohol and or illegal drug use while on prescription medicines. In the event of worsening symptoms, patient is instructed to call the crisis hotline, 911 and or go to the nearest ED for appropriate evaluation and treatment of symptoms. Follow-up with your primary care provider for your other medical issues, concerns and or health care needs.    Total Discharge Time:  Greater than 30 minutes.  Signed: Sanjuana Kava, PMHNP-BC Agree with assessment and plan Madie Reno A. Dub Mikes, M.D. 08/25/2013, 10:16 AM

## 2013-08-30 NOTE — Progress Notes (Signed)
Patient Discharge Instructions:  After Visit Summary (AVS):   Faxed to:  08/30/13 Discharge Summary Note:   Faxed to:  08/30/13 Psychiatric Admission Assessment Note:   Faxed to:  08/30/13 Suicide Risk Assessment - Discharge Assessment:   Faxed to:  08/30/13 Faxed/Sent to the Next Level Care provider:  08/30/13 Faxed to [facility] @ [phone]  Jerelene Redden, 08/30/2013, 4:09 PM

## 2013-08-30 NOTE — Clinical Social Work Note (Signed)
[facility] called. They did not receive d/c paperwork or meds. CSW faxed.

## 2014-03-16 ENCOUNTER — Other Ambulatory Visit: Payer: Self-pay | Admitting: Family Medicine

## 2014-03-16 ENCOUNTER — Ambulatory Visit
Admission: RE | Admit: 2014-03-16 | Discharge: 2014-03-16 | Disposition: A | Discharge status: Home / Self Care | Payer: BC Managed Care – PPO | Source: Ambulatory Visit | Attending: Family Medicine | Admitting: Family Medicine

## 2014-03-16 DIAGNOSIS — R4020 Unspecified coma: Secondary | ICD-10-CM

## 2014-03-16 DIAGNOSIS — S0990XA Unspecified injury of head, initial encounter: Secondary | ICD-10-CM

## 2014-04-14 ENCOUNTER — Other Ambulatory Visit: Payer: Self-pay | Admitting: Orthopaedic Surgery

## 2014-04-14 DIAGNOSIS — M545 Low back pain, unspecified: Secondary | ICD-10-CM

## 2014-04-25 ENCOUNTER — Emergency Department (HOSPITAL_COMMUNITY): Payer: BC Managed Care – PPO

## 2014-04-25 ENCOUNTER — Emergency Department (HOSPITAL_COMMUNITY)
Admission: EM | Admit: 2014-04-25 | Discharge: 2014-04-26 | Disposition: A | Discharge status: Home / Self Care | Payer: BC Managed Care – PPO | Attending: Emergency Medicine | Admitting: Emergency Medicine

## 2014-04-25 ENCOUNTER — Encounter (HOSPITAL_COMMUNITY): Payer: Self-pay | Admitting: Emergency Medicine

## 2014-04-25 DIAGNOSIS — G40909 Epilepsy, unspecified, not intractable, without status epilepticus: Secondary | ICD-10-CM | POA: Insufficient documentation

## 2014-04-25 DIAGNOSIS — Z87891 Personal history of nicotine dependence: Secondary | ICD-10-CM | POA: Insufficient documentation

## 2014-04-25 DIAGNOSIS — I10 Essential (primary) hypertension: Secondary | ICD-10-CM | POA: Insufficient documentation

## 2014-04-25 DIAGNOSIS — Z79899 Other long term (current) drug therapy: Secondary | ICD-10-CM | POA: Insufficient documentation

## 2014-04-25 DIAGNOSIS — J45909 Unspecified asthma, uncomplicated: Secondary | ICD-10-CM | POA: Insufficient documentation

## 2014-04-25 DIAGNOSIS — R569 Unspecified convulsions: Secondary | ICD-10-CM

## 2014-04-25 DIAGNOSIS — Z8719 Personal history of other diseases of the digestive system: Secondary | ICD-10-CM | POA: Insufficient documentation

## 2014-04-25 DIAGNOSIS — F410 Panic disorder [episodic paroxysmal anxiety] without agoraphobia: Secondary | ICD-10-CM | POA: Insufficient documentation

## 2014-04-25 DIAGNOSIS — E119 Type 2 diabetes mellitus without complications: Secondary | ICD-10-CM | POA: Insufficient documentation

## 2014-04-25 LAB — CBG MONITORING, ED: GLUCOSE-CAPILLARY: 206 mg/dL — AB (ref 70–99)

## 2014-04-25 MED ORDER — SODIUM CHLORIDE 0.9 % IV BOLUS (SEPSIS)
2000.0000 mL | Freq: Once | INTRAVENOUS | Status: AC
  Administered 2014-04-26: 2000 mL via INTRAVENOUS

## 2014-04-25 NOTE — ED Notes (Signed)
Patient transported to CT 

## 2014-04-25 NOTE — ED Notes (Signed)
Bed: [id number] Expected date:  Expected time:  Means of arrival:  Comments: EMS [age] M seizure
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
## 2014-04-25 NOTE — ED Notes (Signed)
Pt requesting to have IV taken out and to re-start another IV.

## 2014-04-26 LAB — CBC WITH DIFFERENTIAL/PLATELET
Basophils Absolute: 0 10*3/uL (ref 0.0–0.1)
Basophils Relative: 0 % (ref 0–1)
Eosinophils Absolute: 0.1 10*3/uL (ref 0.0–0.7)
Eosinophils Relative: 2 % (ref 0–5)
HCT: 36.7 % — ABNORMAL LOW (ref 39.0–52.0)
Hemoglobin: 12.5 g/dL — ABNORMAL LOW (ref 13.0–17.0)
Lymphocytes Relative: 13 % (ref 12–46)
Lymphs Abs: 0.9 10*3/uL (ref 0.7–4.0)
MCH: 32.2 pg (ref 26.0–34.0)
MCHC: 34.1 g/dL (ref 30.0–36.0)
MCV: 94.6 fL (ref 78.0–100.0)
Monocytes Absolute: 0.6 10*3/uL (ref 0.1–1.0)
Monocytes Relative: 9 % (ref 3–12)
Neutro Abs: 5.4 10*3/uL (ref 1.7–7.7)
Neutrophils Relative %: 76 % (ref 43–77)
Platelets: 77 10*3/uL — ABNORMAL LOW (ref 150–400)
RBC: 3.88 MIL/uL — ABNORMAL LOW (ref 4.22–5.81)
RDW: 16.1 % — ABNORMAL HIGH (ref 11.5–15.5)
WBC: 7.1 10*3/uL (ref 4.0–10.5)

## 2014-04-26 LAB — COMPREHENSIVE METABOLIC PANEL
ALT: 59 U/L — ABNORMAL HIGH (ref 0–53)
AST: 121 U/L — ABNORMAL HIGH (ref 0–37)
Albumin: 3.4 g/dL — ABNORMAL LOW (ref 3.5–5.2)
Alkaline Phosphatase: 100 U/L (ref 39–117)
BUN: 20 mg/dL (ref 6–23)
CO2: 23 mEq/L (ref 19–32)
Calcium: 8.7 mg/dL (ref 8.4–10.5)
Chloride: 95 mEq/L — ABNORMAL LOW (ref 96–112)
Creatinine, Ser: 1.28 mg/dL (ref 0.50–1.35)
GFR calc Af Amer: 74 mL/min — ABNORMAL LOW (ref >90)
GFR calc non Af Amer: 64 mL/min — ABNORMAL LOW (ref >90)
Glucose, Bld: 231 mg/dL — ABNORMAL HIGH (ref 70–99)
Potassium: 3.8 mEq/L (ref 3.7–5.3)
Sodium: 134 mEq/L — ABNORMAL LOW (ref 137–147)
Total Bilirubin: 1.8 mg/dL — ABNORMAL HIGH (ref 0.3–1.2)
Total Protein: 6.7 g/dL (ref 6.0–8.3)

## 2014-04-26 LAB — ETHANOL: Alcohol, Ethyl (B): <11 mg/dL (ref 0–11)

## 2014-04-26 LAB — CBG MONITORING, ED: GLUCOSE-CAPILLARY: 137 mg/dL — AB (ref 70–99)

## 2014-04-26 LAB — URINALYSIS, ROUTINE W REFLEX MICROSCOPIC
Bilirubin Urine: NEGATIVE
Glucose, UA: 100 mg/dL — AB
Hgb urine dipstick: NEGATIVE
Ketones, ur: NEGATIVE mg/dL
Leukocytes, UA: NEGATIVE
Nitrite: NEGATIVE
Protein, ur: NEGATIVE mg/dL
Specific Gravity, Urine: 1.017 (ref 1.005–1.030)
Urobilinogen, UA: 2 mg/dL — ABNORMAL HIGH (ref 0.0–1.0)
pH: 6 (ref 5.0–8.0)

## 2014-04-26 NOTE — ED Provider Notes (Signed)
Medical screening examination/treatment/procedure(s) were performed by non-physician practitioner and as supervising physician I was immediately available for consultation/collaboration.   EKG Interpretation None        Zayne Draheim, MD 04/26/14 0300 

## 2014-04-26 NOTE — ED Notes (Signed)
Pt able to ambulate without difficulty.

## 2014-04-26 NOTE — ED Provider Notes (Signed)
CSN: [id number]     Arrival date & time 04/25/14  2204 History   First MD Initiated Contact with Patient 04/25/14 2305     Chief Complaint  Patient presents with  . Seizures     (Consider location/radiation/quality/duration/timing/severity/associated sxs/prior Treatment) HPI Patient presents to the emergency department with possible seizure.  The patient has had a questionable history of seizure in the past.  The patient, states, that he fell 2 weeks ago, hitting his head or losing consciousness.  Patient also had a fall about 1 week ago hurting his ankle.  Patient denies drug or alcohol use.  The patient denies chest pain, shortness of breath, headache, blurred vision, weakness, dizziness, numbness, nausea, vomiting, diarrhea, back pain, neck pain, rash, fever, lethargy, or syncope tonight.  The patient was at home when his wife states, that he fell on the floor and was shaking and foaming at the mouth and turn blue.  The patient regained consciousness after about 3 minutes.  The patient did not have any other seizure activity, although it is reported and nursing note that he did. Past Medical History  Diagnosis Date  . Asthma   . Alcoholic   . Rectal bleeding   . Hemorrhoids   . Hypertension   . Panic attacks   . Diabetes mellitus without complication    Past Surgical History  Procedure Laterality Date  . Shoulder surgery      seperated surgery  . R-shoulder surgery     Family History  Problem Relation Age of Onset  . Diabetes Father   . Hypertension Father   . Hypertension Mother    History  Substance Use Topics  . Smoking status: Former Smoker    Types: Cigarettes  . Smokeless tobacco: Not on file  . Alcohol Use: No     Comment: "15L per day"    Review of Systems  All other systems negative except as documented in the HPI. All pertinent positives and negatives as reviewed in the HPI.  Allergies  Review of patient's allergies indicates no known allergies.  Home  Medications   Prior to Admission medications   Medication Sig Start Date End Date Taking? Authorizing Provider  citalopram (CELEXA) 10 MG tablet Take 10 mg by mouth daily.   Yes Historical Provider, MD  glimepiride (AMARYL) 4 MG tablet Take 1 tablet (4 mg total) by mouth 2 (two) times daily. For diabetes management 08/25/13  Yes Sanjuana KavaAgnes I Nwoko, NP  hydrOXYzine (ATARAX/VISTARIL) 50 MG tablet Take 1 tablet (50 mg total) by mouth at bedtime as needed (sleep). 08/25/13  Yes Sanjuana KavaAgnes I Nwoko, NP  ipratropium (ATROVENT HFA) 17 MCG/ACT inhaler Inhale 2 puffs into the lungs every 6 (six) hours.   Yes Historical Provider, MD  Ipratropium-Albuterol (COMBIVENT RESPIMAT) 20-100 MCG/ACT AERS respimat Inhale 1 puff into the lungs as needed for wheezing or shortness of breath.   Yes Historical Provider, MD  lamoTRIgine (LAMICTAL) 100 MG tablet Take 100 mg by mouth daily.   Yes Historical Provider, MD  losartan-hydrochlorothiazide (HYZAAR) 100-12.5 MG per tablet Take 1 tablet by mouth every morning. For hypertension 08/25/13  Yes Sanjuana KavaAgnes I Nwoko, NP  metFORMIN (GLUCOPHAGE) 1000 MG tablet Take 1 tablet (1,000 mg total) by mouth 2 (two) times daily with a meal. For diabetes management 08/25/13  Yes Sanjuana KavaAgnes I Nwoko, NP  traZODone (DESYREL) 100 MG tablet Take 50-100 mg by mouth at bedtime as needed for sleep.   Yes Historical Provider, MD   BP 119/60  Pulse 99  Temp(Src) 98.7 F (37.1 C) (Oral)  Resp 20  Ht 5\' 10"  (1.778 m)  Wt 174 lb (78.926 kg)  BMI 24.97 kg/m2  SpO2 91% Physical Exam  Nursing note and vitals reviewed. Constitutional: He is oriented to person, place, and time. He appears well-developed and well-nourished. No distress.  HENT:  Head: Normocephalic and atraumatic.  Mouth/Throat: Oropharynx is clear and moist.  Eyes: Pupils are equal, round, and reactive to light.  Neck: Normal range of motion. Neck supple.  Cardiovascular: Normal rate, regular rhythm and normal heart sounds.  Exam reveals no gallop  and no friction rub.   No murmur heard. Pulmonary/Chest: Effort normal and breath sounds normal. He has no wheezes.  Abdominal: Soft. Bowel sounds are normal.  Neurological: He is alert and oriented to person, place, and time. He exhibits normal muscle tone. Coordination normal.  Skin: Skin is warm and dry.    ED Course  Procedures (including critical care time) Labs Review Labs Reviewed  CBC WITH DIFFERENTIAL - Abnormal; Notable for the following:    RBC 3.88 (*)    Hemoglobin 12.5 (*)    HCT 36.7 (*)    RDW 16.1 (*)    Platelets 77 (*)    All other components within normal limits  URINALYSIS, ROUTINE W REFLEX MICROSCOPIC - Abnormal; Notable for the following:    Color, Urine AMBER (*)    Glucose, UA 100 (*)    Urobilinogen, UA 2.0 (*)    All other components within normal limits  CBG MONITORING, ED - Abnormal; Notable for the following:    Glucose-Capillary 206 (*)    All other components within normal limits  COMPREHENSIVE METABOLIC PANEL  ETHANOL    Imaging Review Ct Head Wo Contrast  04/26/2014   CLINICAL DATA:  Seizure.  EXAM: CT HEAD WITHOUT CONTRAST  TECHNIQUE: Contiguous axial images were obtained from the base of the skull through the vertex without intravenous contrast.  COMPARISON:  None.  FINDINGS: The ventricles and sulci are normal. No intraparenchymal hemorrhage, mass effect nor midline shift. No acute large vascular territory infarcts.  No abnormal extra-axial fluid collections. Basal cisterns are patent.  No skull fracture. The included ocular globes and orbital contents are non-suspicious. Under pneumatized mastoid air cells. Paranasal sinus mucosal thickening with sphenoid air-fluid level, right maxillary sinus wall thickening consistent with chronic sinusitis.  IMPRESSION: No acute intracranial process.  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   Electronically Signed   By: Awilda Metroourtnay  Bloomer   On: 04/26/2014 00:34    And review of patient's chart he has had a  significant past medical history of alcoholism.  Based on the patient's history of multiple falls in his prior alcohol use.  This could be related to alcohol consumption.  I will speak with urology about the patient   Date: 04/26/2014  Rate: 97  Rhythm: normal sinus rhythm  QRS Axis: normal  Intervals: normal  ST/T Wave abnormalities: nonspecific ST/T changes  Conduction Disutrbances:none  Narrative Interpretation:   Old EKG Reviewed: none available    Carlyle Dollyhristopher W Shelbylynn Walczyk, PA-C 04/26/14 [phone]

## 2014-04-26 NOTE — Discharge Instructions (Signed)
Call to make appointment to followup with a neurologist. Do not drive or operate heavy equipment until you are cleared by a neurologist. Return immediately for worsening symptoms or for any concerns.

## 2014-04-26 NOTE — ED Notes (Signed)
Pt ambulated with out assistance around emergency room department. RN notified

## 2014-04-27 ENCOUNTER — Ambulatory Visit (INDEPENDENT_AMBULATORY_CARE_PROVIDER_SITE_OTHER): Payer: BC Managed Care – PPO | Admitting: Radiology

## 2014-04-27 ENCOUNTER — Encounter: Payer: Self-pay | Admitting: Neurology

## 2014-04-27 ENCOUNTER — Ambulatory Visit (INDEPENDENT_AMBULATORY_CARE_PROVIDER_SITE_OTHER): Payer: BC Managed Care – PPO | Admitting: Neurology

## 2014-04-27 VITALS — BP 157/89 | HR 84 | Ht 69.0 in | Wt 177.5 lb

## 2014-04-27 DIAGNOSIS — R569 Unspecified convulsions: Secondary | ICD-10-CM | POA: Insufficient documentation

## 2014-04-27 NOTE — Patient Instructions (Signed)
Overall you are doing fairly well but I do want to suggest a few things today:   Remember to drink plenty of fluid, eat healthy meals and do not skip any meals. Try to eat protein with a every meal and eat a healthy snack such as fruit or nuts in between meals. Try to keep a regular sleep-wake schedule and try to exercise daily, particularly in the form of walking, 20-30 minutes a day, if you can.   As far as diagnostic testing:  1)Please schedule an EEG when you check out 2)I would like you to have a MRI of the brain, you will be called to schedule this  Please call us with any interim questions, concerns, problems, updates or refill requests.   Please avoid driving for 6 months per AAN guidelines  My clinical assistant and will answer any of your questions and relay your messages to me and also relay most of my messages to you.   Our phone number is [phone]. We also have an after hours call service for urgent matters and there is a physician on-call for urgent questions. For any emergencies you know to call 911 or go to the nearest emergency room

## 2014-04-27 NOTE — Progress Notes (Signed)
[facility]    Provider:  Dr Hosie PoissonSumner Referring Provider: Halina MaidensStallings, Sheila C., MD Primary Care Physician:  Warrick ParisianSTALLINGS,SHEILA, MD  CC:  seizure  HPI:  Hunter Davis is a 48 y.o. male here as a referral from Dr. Creta LevinStallings for seizure activity  2 days ago had multiple episodes. Noted there were loud noises and it made him feel strange, he became very sensitive to sounds, told his wife he didn't feel good and then does not recall anything until EMS arrived. Wife notes he had 2 events, first one he foamed at the mouth, turned blue, lasted around 1 minute, then stopped for around 1 minute and had another event for around 1 minute. During events was stiff, arms flexing, eyes rolled back. Went to the ED, was evaluated, discharged home. Later that night had 2 further episodes similar to above. He notes severe lack of sleep for the 2 days prior these episodes. No recent illnesses, fever, neck stiffness. Fell and hit head with LOC around 1 month.   He has had episodes of LOC where he gets very dark vision, gets light headed, will sit down and after a few minutes feel better.   He is on lamictal for bipolar disorder, he decreased his dose in December to 100mg  daily. No tobacco but states frequent EtOH usage. Can drink 2 to 3 shots a night, last drink was around 2 days prior to episodes.   No prior seizure history, no family history of seizures.   ED notes 04/26/2014: Patient presents to the emergency department with possible seizure. The patient has had a questionable history of seizure in the past. The patient, states, that he fell 2 weeks ago, hitting his head or losing consciousness. Patient also had a fall about 1 week ago hurting his ankle. Patient denies drug or alcohol use. The patient denies chest pain, shortness of breath, headache, blurred vision, weakness, dizziness, numbness, nausea, vomiting, diarrhea, back pain, neck pain, rash, fever, lethargy, or syncope tonight. The  patient was at home when his wife states, that he fell on the floor and was shaking and foaming at the mouth and turn blue. The patient regained consciousness after about 3 minutes. The patient did not have any other seizure activity, although it is reported and nursing note that he did.  Review of Systems: Out of a complete 14 system review, the patient complains of only the following symptoms, and all other reviewed systems are negative. + memory loss, headache, weakness, insomnia, snoring, seizure, passing out, feeling hot/cold  History   Social History  . Marital Status: Married    Spouse Name: Lanora Manislizabeth    Number of Children: 0  . Years of Education: college   Occupational History  . N/A    Social History Main Topics  . Smoking status: Former Smoker    Types: Cigarettes  . Smokeless tobacco: Not on file  . Alcohol Use: Yes     Comment: "15L per day"  . Drug Use: No  . Sexual Activity: Not Currently    Birth Control/ Protection: None   Other Topics Concern  . Not on file   Social History Narrative  . No narrative on file    Family History  Problem Relation Age of Onset  . Diabetes Father   . Hypertension Father   . Hypertension Mother     Past Medical History  Diagnosis Date  . Asthma   . Alcoholic   . Rectal bleeding   . Hemorrhoids   .  Hypertension   . Panic attacks   . Diabetes mellitus without complication     Past Surgical History  Procedure Laterality Date  . Shoulder surgery      seperated surgery  . R-shoulder surgery      Current Outpatient Prescriptions  Medication Sig Dispense Refill  . citalopram (CELEXA) 10 MG tablet Take 10 mg by mouth daily.      Marland Kitchen. glimepiride (AMARYL) 4 MG tablet Take 1 tablet (4 mg total) by mouth 2 (two) times daily. For diabetes management      . hydrOXYzine (ATARAX/VISTARIL) 50 MG tablet Take 1 tablet (50 mg total) by mouth at bedtime as needed (sleep).  30 tablet  0  . ipratropium (ATROVENT HFA) 17 MCG/ACT  inhaler Inhale 2 puffs into the lungs every 6 (six) hours.      . Ipratropium-Albuterol (COMBIVENT RESPIMAT) 20-100 MCG/ACT AERS respimat Inhale 1 puff into the lungs as needed for wheezing or shortness of breath.      . lamoTRIgine (LAMICTAL) 100 MG tablet Take 100 mg by mouth daily.      Marland Kitchen. losartan-hydrochlorothiazide (HYZAAR) 100-12.5 MG per tablet Take 1 tablet by mouth every morning. For hypertension      . metFORMIN (GLUCOPHAGE) 1000 MG tablet Take 1 tablet (1,000 mg total) by mouth 2 (two) times daily with a meal. For diabetes management      . traZODone (DESYREL) 100 MG tablet Take 50-100 mg by mouth at bedtime as needed for sleep.       No current facility-administered medications for this visit.    Allergies as of 04/27/2014  . (No Known Allergies)    Vitals: BP 157/89  Pulse 84  Ht 5\' 9"  (1.753 m)  Wt 177 lb 8 oz (80.513 kg)  BMI 26.20 kg/m2 Last Weight:  Wt Readings from Last 1 Encounters:  04/27/14 177 lb 8 oz (80.513 kg)   Last Height:   Ht Readings from Last 1 Encounters:  04/27/14 5\' 9"  (1.753 m)     Physical exam: Exam: Gen: NAD, conversant Eyes: anicteric sclerae, moist conjunctivae HENT: Atraumatic, oropharynx clear Neck: Trachea midline; supple,  Lungs: CTA, no wheezing, rales, rhonic                          CV: RRR, no MRG Abdomen: Soft, non-tender;  Extremities: No peripheral edema  Skin: Normal temperature, no rash,  Psych: Appropriate affect, pleasant  Neuro: MS: AA&Ox3, appropriately interactive, normal affect   Speech: fluent w/o paraphasic error  Memory: good recent and remote recall  CN: PERRL, EOMI no nystagmus, no ptosis, sensation intact to LT V1-V3 bilat, face symmetric, no weakness, hearing grossly intact, palate elevates symmetrically, shoulder shrug 5/5 bilat,  tongue protrudes midline, no fasiculations noted.  Motor: normal bulk and tone Strength: 5/5  In all extremities  Coord: rapid alternating and point-to-point (FNF,  HTS) movements intact.  Reflexes: symmetrical, bilat downgoing toes  Sens: LT intact in all extremities  Gait: posture, stance, stride and arm-swing normal. Tandem gait intact. Able to walk on heels and toes. Romberg absent.   Assessment:  After physical and neurologic examination, review of laboratory studies, imaging, neurophysiology testing and pre-existing records, assessment will be reviewed on the problem list.  Plan:  Treatment plan and additional workup will be reviewed under Problem List.  1)Seizures  48y/o gentleman presenting for initial evaluation of episodes concerning for seizures. Per patient and his wife he had 4 episodes in a 24hr  period, the events were consistent with a GTC seizure. Unclear etiology. Suspect they are likely provoked due to lack of sleep and EtOH usage. Will check EEG and MRI to rule out structural causes. Based on history have low suspicion of an infectious etiology but if events reoccur will consider LP. Based on likely provoked nature of events will hold off on starting AED at this time. If events reoccur or any abnormalities found on imaging will plan to start AED. Patient counseled to avoid driving for 6 months. Follow up once workup completed.   Elspeth Cho, DO  [facility] [address] [place], [place] [address]  Phone [phone] Fax [phone]

## 2014-04-27 NOTE — Procedures (Signed)
    History:  Hunter Davis is a 48 year old gentleman who was seen in the emergency room on 04/26/2014. The patient was noted to have an episode of loss of consciousness, foaming at the mouth, stiffening of the arms. The patient had lack of sleep for 2 days prior to this episode. The patient is being evaluated for possible seizures.  This is a routine EEG. No skull defects are noted. Medications include Celexa, Amaryl, Vistaril, Atrovent, Lamictal, Hyzaar, metformin, and trazodone.   EEG classification: Normal awake and drowsy  Description of the recording: The background rhythms of this recording consists of a fairly well modulated medium amplitude alpha rhythm of 10 Hz that is reactive to eye opening and closure. As the record progresses, the patient appears to remain in the waking state throughout the recording. Photic stimulation was performed, resulting in a bilateral and symmetric photic driving response. Hyperventilation was not performed. Toward the end of the recording, the patient enters the drowsy state with slight symmetric slowing seen. The patient never enters stage II sleep. At no time during the recording does there appear to be evidence of spike or spike wave discharges or evidence of focal slowing. EKG monitor shows no evidence of cardiac rhythm abnormalities with a heart rate of 78.  Impression: This is a normal EEG recording in the waking and drowsy state. No evidence of ictal or interictal discharges are seen.

## 2014-04-28 ENCOUNTER — Telehealth: Payer: Self-pay | Admitting: Neurology

## 2014-04-28 NOTE — Telephone Encounter (Signed)
Called patient to discuss normal EEG results. He expressed understanding. Reports that he wishes to hold off on brain MRI at this time. Counseled him that based on his age and fact he had multiple events I would recommend going ahead with the study to rule out a structural process. He expresses understanding but wishes to cancel the test. Will d/c MRI at this time.

## 2014-05-01 ENCOUNTER — Other Ambulatory Visit: Payer: Self-pay | Admitting: Neurology

## 2014-05-01 ENCOUNTER — Telehealth: Payer: Self-pay | Admitting: *Deleted

## 2014-05-01 DIAGNOSIS — R569 Unspecified convulsions: Secondary | ICD-10-CM

## 2014-05-01 NOTE — Telephone Encounter (Signed)
I will reorder it as I believe it was cancelled. Thanks.

## 2014-05-01 NOTE — Telephone Encounter (Signed)
Patient calling to inform Dr. Hosie PoissonSumner that he's ready to proceed with MRI after discussing with his spouse.  Thank you

## 2014-05-02 NOTE — Telephone Encounter (Signed)
TC to pt and LMVM home # that did get message.   Order for MRI done.  Once preauthorization done they will call to schedule.  If questions to call back.

## 2014-06-18 IMAGING — CT CT HEAD W/O CM
2 series · 17 of 30 positions shown, 20 images · non-contrast
Comparison: None.

CLINICAL DATA: Seizure.

EXAM:
CT HEAD WITHOUT CONTRAST
TECHNIQUE: Contiguous axial images were obtained from the base of the skull
through the vertex without intravenous contrast.

[Series 2: head w/o · axial · non-contrast · 0.41mm/px · z∈[+1304,+1429]mm · 9 of 33 slices shown, 12 images]
[im 4/33  brain]
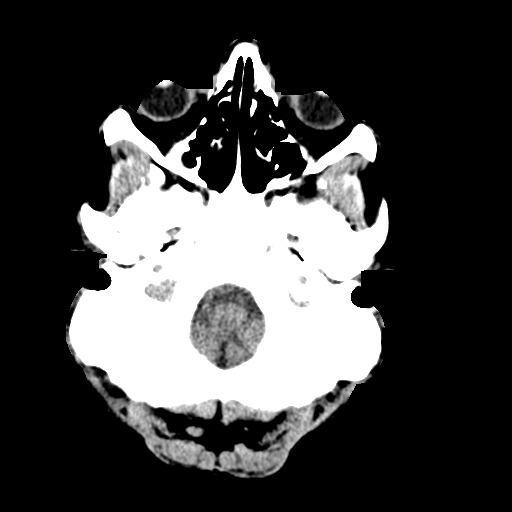
[im 4/33  bone]
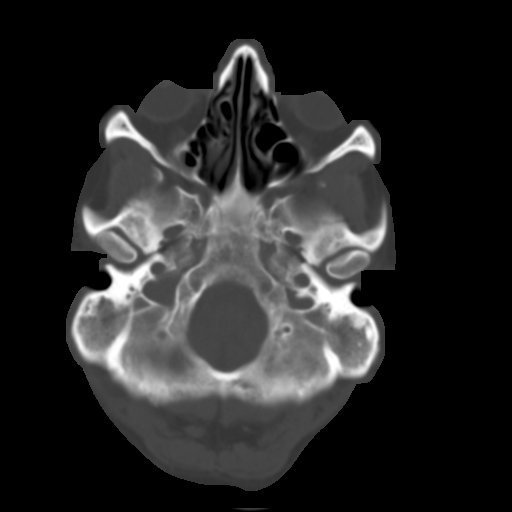
[im 7/33  brain]
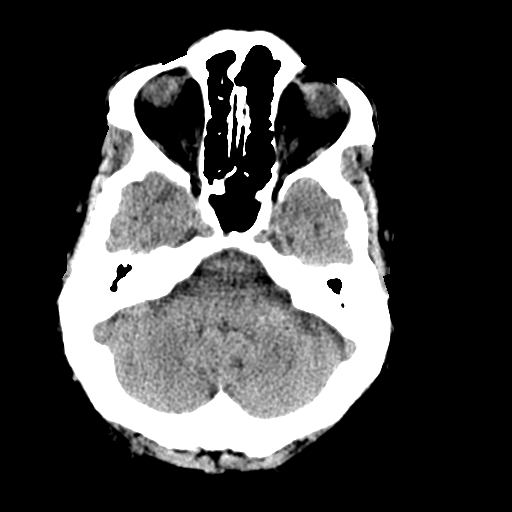
[im 10/33  brain]
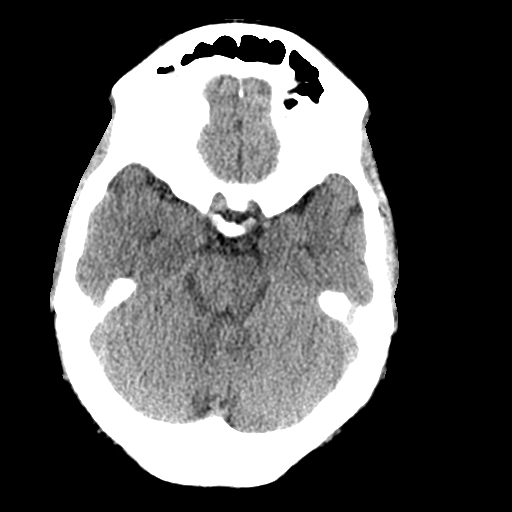
[im 13/33  brain]
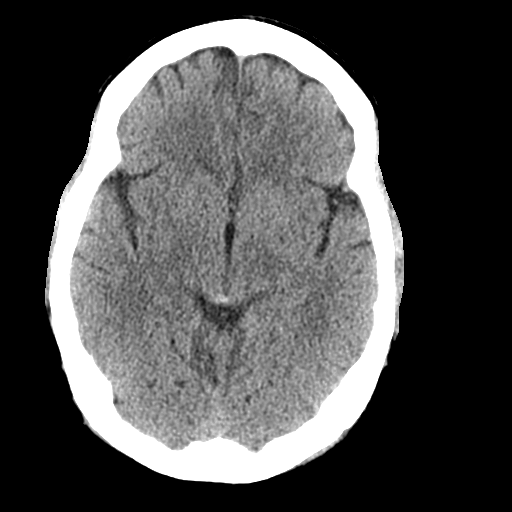
[im 17/33  brain]
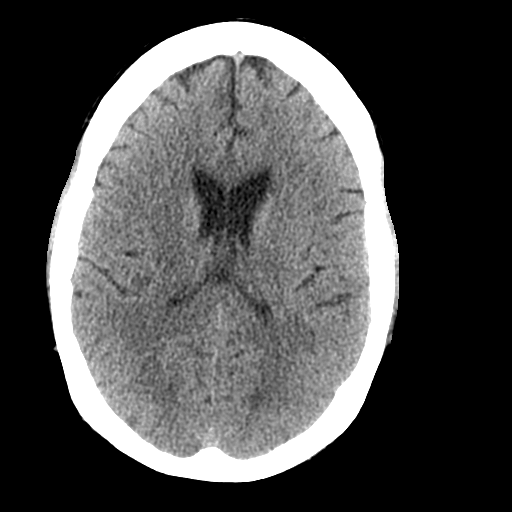
[im 17/33  bone]
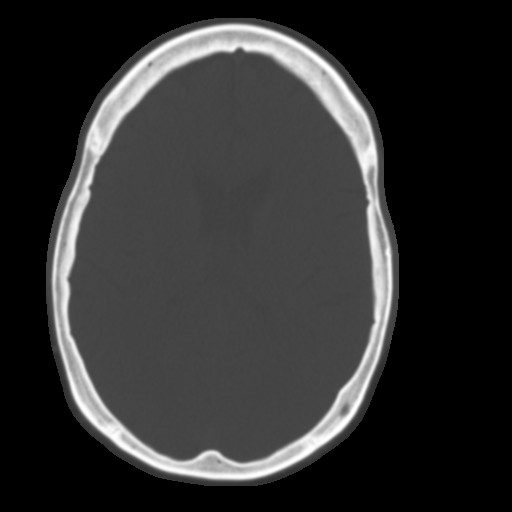
[im 20/33  brain]
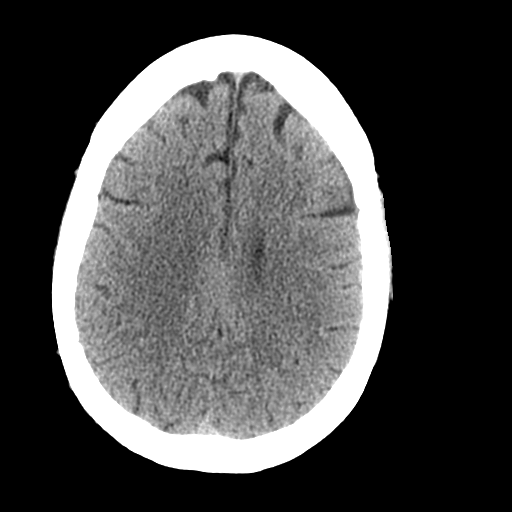
[im 23/33  brain]
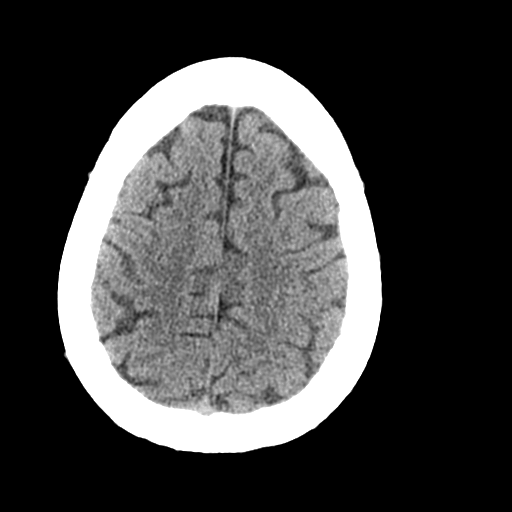
[im 26/33  brain]
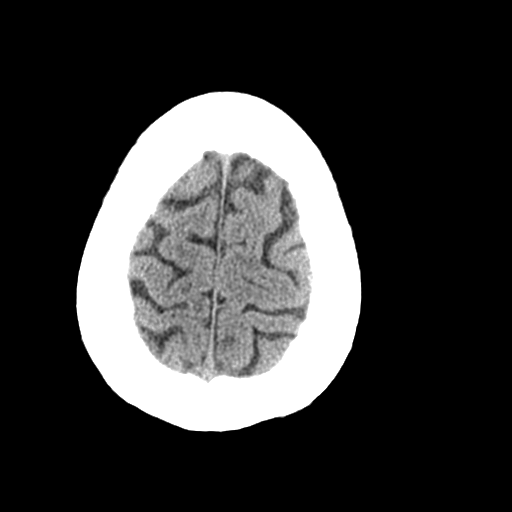
[im 29/33  brain]
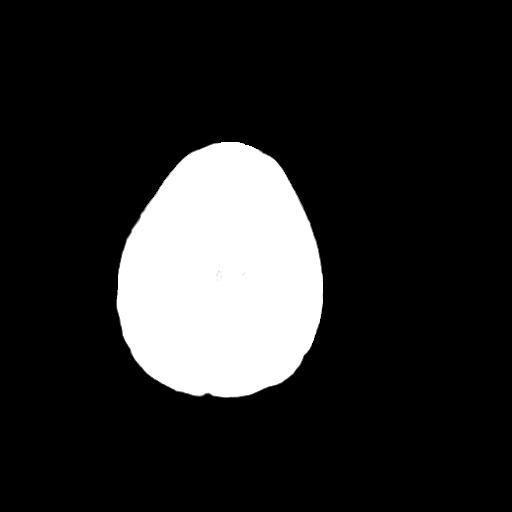
[im 29/33  bone]
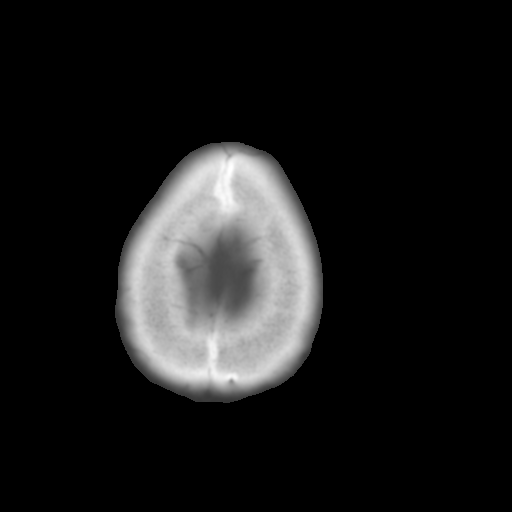

[Series 3: bone windows · axial · 0.41mm/px · z∈[+1304,+1430]mm · 8 of 54 slices shown]
[im 6/54  bone]
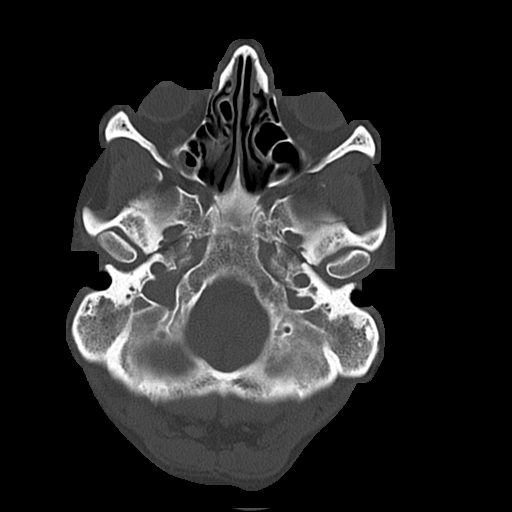
[im 12/54  bone]
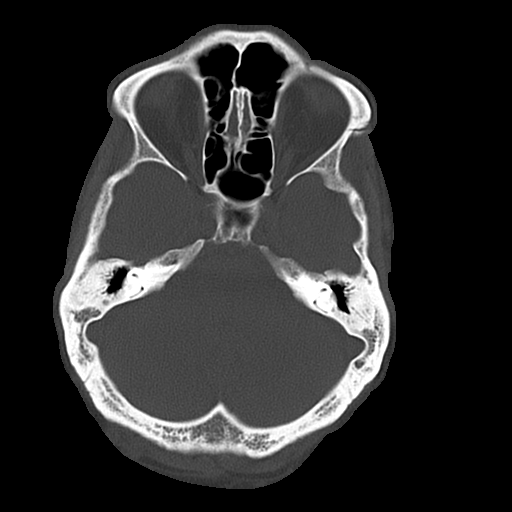
[im 18/54  bone]
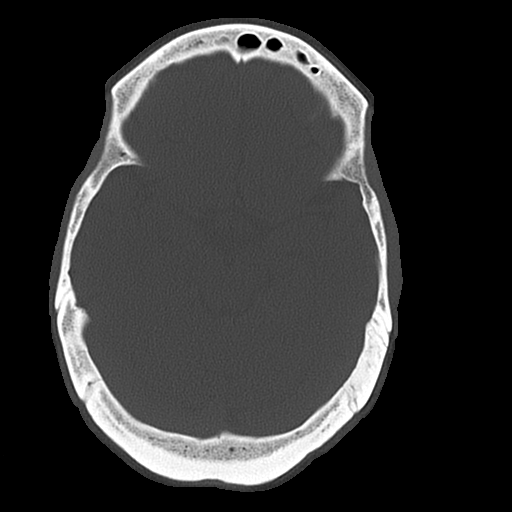
[im 24/54  bone]
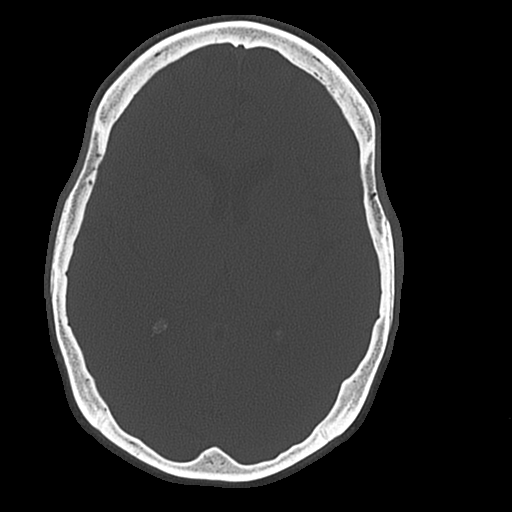
[im 30/54  bone]
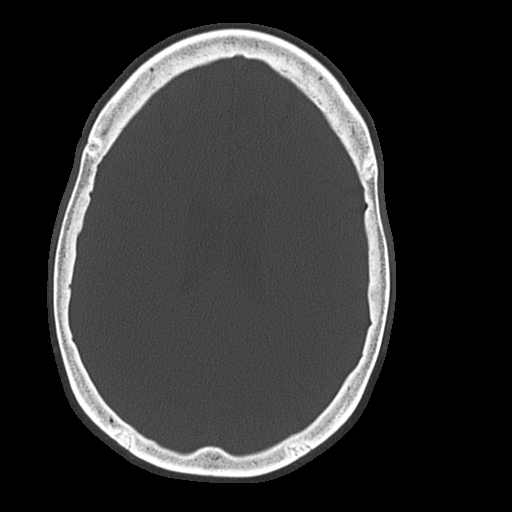
[im 36/54  bone]
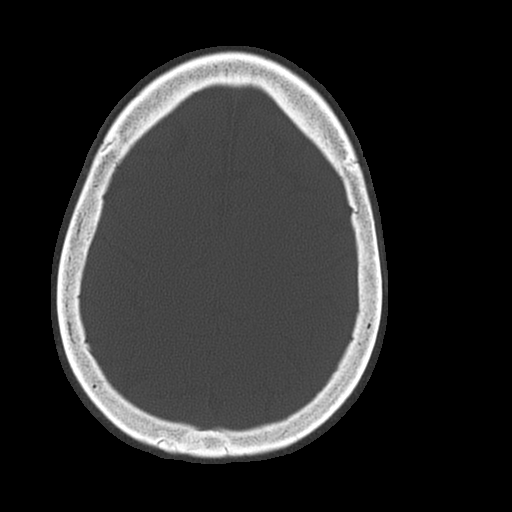
[im 42/54  bone]
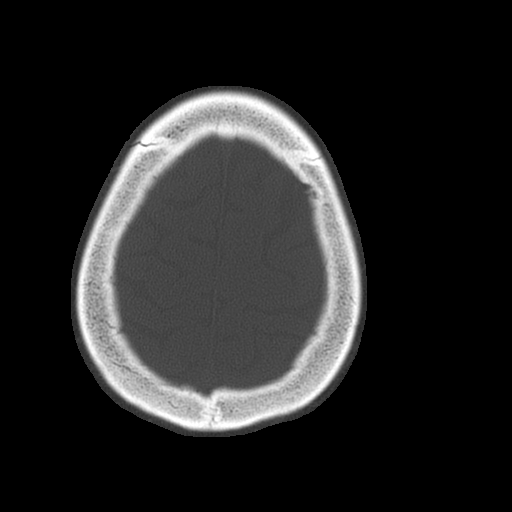
[im 48/54  bone]
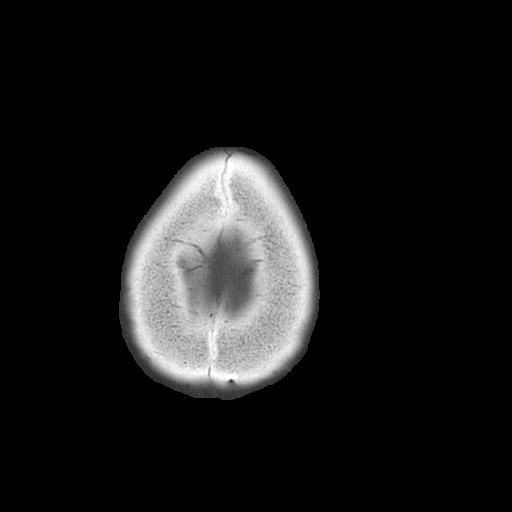

[17 of 30 positions shown; findings below may reference images not displayed]

FINDINGS: The ventricles and sulci are normal. No intraparenchymal hemorrhage,
mass effect nor midline shift. No acute large vascular territory
infarcts.

No abnormal extra-axial fluid collections. Basal cisterns are
patent.

No skull fracture. The included ocular globes and orbital contents
are non-suspicious. Under pneumatized mastoid air cells. Paranasal
sinus mucosal thickening with sphenoid air-fluid level, right
maxillary sinus wall thickening consistent with chronic sinusitis.
IMPRESSION: No acute intracranial process.

Acute on chronic paranasal sinusitis.

  By: Jong Hoon Dewitt

## 2014-08-15 ENCOUNTER — Emergency Department (HOSPITAL_COMMUNITY): Payer: BC Managed Care – PPO

## 2014-08-15 ENCOUNTER — Encounter (HOSPITAL_COMMUNITY): Payer: Self-pay | Admitting: Emergency Medicine

## 2014-08-15 ENCOUNTER — Emergency Department (HOSPITAL_COMMUNITY)
Admission: EM | Admit: 2014-08-15 | Discharge: 2014-08-29 | Disposition: E | Payer: BC Managed Care – PPO | Attending: Emergency Medicine | Admitting: Emergency Medicine

## 2014-08-15 DIAGNOSIS — F410 Panic disorder [episodic paroxysmal anxiety] without agoraphobia: Secondary | ICD-10-CM | POA: Diagnosis not present

## 2014-08-15 DIAGNOSIS — F10929 Alcohol use, unspecified with intoxication, unspecified: Secondary | ICD-10-CM

## 2014-08-15 DIAGNOSIS — S069X6A Unspecified intracranial injury with loss of consciousness greater than 24 hours without return to pre-existing conscious level with patient surviving, initial encounter: Secondary | ICD-10-CM | POA: Insufficient documentation

## 2014-08-15 DIAGNOSIS — Z8719 Personal history of other diseases of the digestive system: Secondary | ICD-10-CM | POA: Insufficient documentation

## 2014-08-15 DIAGNOSIS — S270XXA Traumatic pneumothorax, initial encounter: Secondary | ICD-10-CM | POA: Insufficient documentation

## 2014-08-15 DIAGNOSIS — S1093XA Contusion of unspecified part of neck, initial encounter: Secondary | ICD-10-CM

## 2014-08-15 DIAGNOSIS — S71009A Unspecified open wound, unspecified hip, initial encounter: Secondary | ICD-10-CM | POA: Diagnosis not present

## 2014-08-15 DIAGNOSIS — F1010 Alcohol abuse, uncomplicated: Secondary | ICD-10-CM | POA: Diagnosis not present

## 2014-08-15 DIAGNOSIS — E119 Type 2 diabetes mellitus without complications: Secondary | ICD-10-CM | POA: Insufficient documentation

## 2014-08-15 DIAGNOSIS — S272XXA Traumatic hemopneumothorax, initial encounter: Secondary | ICD-10-CM

## 2014-08-15 DIAGNOSIS — J939 Pneumothorax, unspecified: Secondary | ICD-10-CM

## 2014-08-15 DIAGNOSIS — Y92410 Unspecified street and highway as the place of occurrence of the external cause: Secondary | ICD-10-CM | POA: Insufficient documentation

## 2014-08-15 DIAGNOSIS — S0083XA Contusion of other part of head, initial encounter: Secondary | ICD-10-CM | POA: Insufficient documentation

## 2014-08-15 DIAGNOSIS — Y9389 Activity, other specified: Secondary | ICD-10-CM | POA: Insufficient documentation

## 2014-08-15 DIAGNOSIS — S0990XA Unspecified injury of head, initial encounter: Secondary | ICD-10-CM | POA: Diagnosis present

## 2014-08-15 DIAGNOSIS — S2249XA Multiple fractures of ribs, unspecified side, initial encounter for closed fracture: Secondary | ICD-10-CM

## 2014-08-15 DIAGNOSIS — I469 Cardiac arrest, cause unspecified: Secondary | ICD-10-CM

## 2014-08-15 DIAGNOSIS — Z79899 Other long term (current) drug therapy: Secondary | ICD-10-CM | POA: Diagnosis not present

## 2014-08-15 DIAGNOSIS — I10 Essential (primary) hypertension: Secondary | ICD-10-CM | POA: Diagnosis not present

## 2014-08-15 DIAGNOSIS — S71109A Unspecified open wound, unspecified thigh, initial encounter: Secondary | ICD-10-CM | POA: Insufficient documentation

## 2014-08-15 DIAGNOSIS — S0003XA Contusion of scalp, initial encounter: Secondary | ICD-10-CM | POA: Diagnosis not present

## 2014-08-15 LAB — COMPREHENSIVE METABOLIC PANEL
ALT: 57 U/L — ABNORMAL HIGH (ref 0–53)
ANION GAP: 21 — AB (ref 5–15)
AST: 174 U/L — AB (ref 0–37)
Albumin: 2.9 g/dL — ABNORMAL LOW (ref 3.5–5.2)
Alkaline Phosphatase: 69 U/L (ref 39–117)
BUN: 8 mg/dL (ref 6–23)
CALCIUM: 8.1 mg/dL — AB (ref 8.4–10.5)
CO2: 20 meq/L (ref 19–32)
CREATININE: 1.08 mg/dL (ref 0.50–1.35)
Chloride: 109 mEq/L (ref 96–112)
GFR, EST NON AFRICAN AMERICAN: 79 mL/min — AB (ref >90)
GLUCOSE: 132 mg/dL — AB (ref 70–99)
Potassium: 3.8 mEq/L (ref 3.7–5.3)
Sodium: 150 mEq/L — ABNORMAL HIGH (ref 137–147)
Total Bilirubin: 0.8 mg/dL (ref 0.3–1.2)
Total Protein: 5.6 g/dL — ABNORMAL LOW (ref 6.0–8.3)

## 2014-08-15 LAB — I-STAT CHEM 8, ED
BUN: 7 mg/dL (ref 6–23)
CALCIUM ION: 0.99 mmol/L — AB (ref 1.12–1.23)
Chloride: 110 mEq/L (ref 96–112)
Creatinine, Ser: 1.5 mg/dL — ABNORMAL HIGH (ref 0.50–1.35)
GLUCOSE: 128 mg/dL — AB (ref 70–99)
HCT: 37 % — ABNORMAL LOW (ref 39.0–52.0)
Hemoglobin: 12.6 g/dL — ABNORMAL LOW (ref 13.0–17.0)
Potassium: 3.7 mEq/L (ref 3.7–5.3)
Sodium: 148 mEq/L — ABNORMAL HIGH (ref 137–147)
TCO2: 21 mmol/L (ref 0–100)

## 2014-08-15 LAB — PREPARE FRESH FROZEN PLASMA
UNIT DIVISION: 0
Unit division: 0

## 2014-08-15 LAB — CBC WITH DIFFERENTIAL/PLATELET
BASOS PCT: 1 % (ref 0–1)
Basophils Absolute: 0 10*3/uL (ref 0.0–0.1)
EOS PCT: 1 % (ref 0–5)
Eosinophils Absolute: 0.1 10*3/uL (ref 0.0–0.7)
HCT: 35.8 % — ABNORMAL LOW (ref 39.0–52.0)
Hemoglobin: 11.6 g/dL — ABNORMAL LOW (ref 13.0–17.0)
Lymphocytes Relative: 54 % — ABNORMAL HIGH (ref 12–46)
Lymphs Abs: 3.8 10*3/uL (ref 0.7–4.0)
MCH: 30.8 pg (ref 26.0–34.0)
MCHC: 32.4 g/dL (ref 30.0–36.0)
MCV: 95 fL (ref 78.0–100.0)
Monocytes Absolute: 0.2 10*3/uL (ref 0.1–1.0)
Monocytes Relative: 3 % (ref 3–12)
NEUTROS PCT: 41 % — AB (ref 43–77)
Neutro Abs: 2.9 10*3/uL (ref 1.7–7.7)
Platelets: 125 10*3/uL — ABNORMAL LOW (ref 150–400)
RBC: 3.77 MIL/uL — ABNORMAL LOW (ref 4.22–5.81)
RDW: 15.6 % — AB (ref 11.5–15.5)
WBC: 7 10*3/uL (ref 4.0–10.5)

## 2014-08-15 LAB — CDS SEROLOGY

## 2014-08-15 LAB — PROTIME-INR
INR: 1.84 — ABNORMAL HIGH (ref 0.00–1.49)
PROTHROMBIN TIME: 21.3 s — AB (ref 11.6–15.2)

## 2014-08-15 LAB — ETHANOL: ALCOHOL ETHYL (B): 349 mg/dL — AB (ref 0–11)

## 2014-08-15 LAB — ABO/RH: ABO/RH(D): O POS

## 2014-08-15 MED ORDER — EPINEPHRINE HCL 0.1 MG/ML IJ SOSY
PREFILLED_SYRINGE | INTRAMUSCULAR | Status: AC | PRN
  Administered 2014-08-15 (×4): 1 mg via INTRAVENOUS
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
## 2014-08-15 NOTE — ED Notes (Signed)
More family members arrived. Chaplin took them to view the body.

## 2014-08-15 NOTE — ED Notes (Signed)
Family viewing the body, chaplin at bedside.

## 2014-08-15 NOTE — ED Notes (Signed)
Patient time of death occurred at [id number]

## 2014-08-15 NOTE — ED Notes (Signed)
Family updated as to patient's status.

## 2014-08-16 ENCOUNTER — Encounter: Payer: Self-pay | Admitting: Neurology

## 2014-08-16 ENCOUNTER — Telehealth (HOSPITAL_BASED_OUTPATIENT_CLINIC_OR_DEPARTMENT_OTHER): Payer: Self-pay

## 2014-08-16 LAB — TYPE AND SCREEN
ABO/RH(D): O POS
Antibody Screen: NEGATIVE
Unit division: 0
Unit division: 0

## 2014-08-16 MED FILL — Medication: Qty: 1 | Status: AC

## 2014-08-16 NOTE — Telephone Encounter (Signed)
Call from [facility] inquiring whether pt rcvd blood products prior to his death.  Informed per EPIC notes appears pt rcvd 2 units of emergency release blood.
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
## 2014-08-29 DEATH — deceased
